# Patient Record
Sex: Male | Born: 1985 | Race: Black or African American | Hispanic: No | Smoking: Never smoker
Health system: Southern US, Community
[De-identification: ages and names within clinical notes are randomized; demographics above are authoritative.]

## PROBLEM LIST (undated history)

## (undated) DIAGNOSIS — J302 Other seasonal allergic rhinitis: Secondary | ICD-10-CM

## (undated) DIAGNOSIS — D696 Thrombocytopenia, unspecified: Secondary | ICD-10-CM

## (undated) HISTORY — DX: Thrombocytopenia, unspecified: D69.6

## (undated) HISTORY — PX: APPENDECTOMY: SHX54

## (undated) HISTORY — DX: Other seasonal allergic rhinitis: J30.2

---

## 2003-11-11 ENCOUNTER — Emergency Department (HOSPITAL_COMMUNITY): Admission: EM | Admit: 2003-11-11 | Discharge: 2003-11-11 | Payer: Self-pay | Admitting: Emergency Medicine

## 2004-06-01 ENCOUNTER — Observation Stay (HOSPITAL_COMMUNITY): Admission: EM | Admit: 2004-06-01 | Discharge: 2004-06-02 | Payer: Self-pay | Admitting: Emergency Medicine

## 2004-06-01 ENCOUNTER — Encounter (INDEPENDENT_AMBULATORY_CARE_PROVIDER_SITE_OTHER): Payer: Self-pay | Admitting: Specialist

## 2005-10-20 ENCOUNTER — Emergency Department (HOSPITAL_COMMUNITY): Admission: EM | Admit: 2005-10-20 | Discharge: 2005-10-20 | Payer: Self-pay | Admitting: Emergency Medicine

## 2006-03-07 ENCOUNTER — Emergency Department (HOSPITAL_COMMUNITY): Admission: EM | Admit: 2006-03-07 | Discharge: 2006-03-07 | Payer: Self-pay | Admitting: Emergency Medicine

## 2008-06-03 ENCOUNTER — Emergency Department (HOSPITAL_COMMUNITY): Admission: EM | Admit: 2008-06-03 | Discharge: 2008-06-03 | Payer: Self-pay | Admitting: Emergency Medicine

## 2009-03-22 ENCOUNTER — Emergency Department (HOSPITAL_COMMUNITY): Admission: EM | Admit: 2009-03-22 | Discharge: 2009-03-22 | Payer: Self-pay | Admitting: Emergency Medicine

## 2009-08-23 ENCOUNTER — Emergency Department (HOSPITAL_COMMUNITY): Admission: EM | Admit: 2009-08-23 | Discharge: 2009-08-24 | Payer: Self-pay | Admitting: Emergency Medicine

## 2010-06-10 ENCOUNTER — Emergency Department (HOSPITAL_COMMUNITY): Admission: EM | Admit: 2010-06-10 | Discharge: 2010-06-10 | Payer: Self-pay | Admitting: Emergency Medicine

## 2010-12-14 LAB — URINALYSIS, ROUTINE W REFLEX MICROSCOPIC
Bilirubin Urine: NEGATIVE
Glucose, UA: NEGATIVE mg/dL
Hgb urine dipstick: NEGATIVE
Nitrite: NEGATIVE
Specific Gravity, Urine: 1.024 (ref 1.005–1.030)
Urobilinogen, UA: 0.2 mg/dL (ref 0.0–1.0)
pH: 5.5 (ref 5.0–8.0)

## 2010-12-14 LAB — GC/CHLAMYDIA PROBE AMP, GENITAL: Chlamydia, DNA Probe: NEGATIVE

## 2011-01-03 LAB — CBC
MCHC: 33.1 g/dL (ref 30.0–36.0)
MCV: 95.6 fL (ref 78.0–100.0)
WBC: 8.6 10*3/uL (ref 4.0–10.5)

## 2011-01-03 LAB — COMPREHENSIVE METABOLIC PANEL
Albumin: 3.9 g/dL (ref 3.5–5.2)
CO2: 25 mEq/L (ref 19–32)
Creatinine, Ser: 0.89 mg/dL (ref 0.4–1.5)
GFR calc Af Amer: >60 mL/min (ref >60)
GFR calc non Af Amer: >60 mL/min (ref >60)
Potassium: 3.4 mEq/L — ABNORMAL LOW (ref 3.5–5.1)
Sodium: 137 mEq/L (ref 135–145)
Total Bilirubin: 1.1 mg/dL (ref 0.3–1.2)
Total Protein: 6.8 g/dL (ref 6.0–8.3)

## 2011-01-03 LAB — DIFFERENTIAL
Basophils Relative: 0 % (ref 0–1)
Eosinophils Relative: 0 % (ref 0–5)
Lymphs Abs: 0.9 10*3/uL (ref 0.7–4.0)
Neutrophils Relative %: 85 % — ABNORMAL HIGH (ref 43–77)

## 2011-01-08 LAB — URINALYSIS, ROUTINE W REFLEX MICROSCOPIC
Bilirubin Urine: NEGATIVE
Ketones, ur: NEGATIVE mg/dL
Nitrite: NEGATIVE
Protein, ur: NEGATIVE mg/dL
Specific Gravity, Urine: 1.025 (ref 1.005–1.030)
pH: 6 (ref 5.0–8.0)

## 2011-01-08 LAB — CBC
HCT: 43.5 % (ref 39.0–52.0)
MCHC: 33.5 g/dL (ref 30.0–36.0)
Platelets: 151 10*3/uL (ref 150–400)
RBC: 4.54 MIL/uL (ref 4.22–5.81)
RDW: 12.9 % (ref 11.5–15.5)

## 2011-01-08 LAB — COMPREHENSIVE METABOLIC PANEL
ALT: 28 U/L (ref 0–53)
AST: 25 U/L (ref 0–37)
BUN: 15 mg/dL (ref 6–23)
Calcium: 9.5 mg/dL (ref 8.4–10.5)
Creatinine, Ser: 1.01 mg/dL (ref 0.4–1.5)
GFR calc non Af Amer: >60 mL/min (ref >60)
Total Bilirubin: 0.8 mg/dL (ref 0.3–1.2)

## 2011-01-08 LAB — DIFFERENTIAL
Basophils Relative: 1 % (ref 0–1)
Eosinophils Absolute: 0.1 10*3/uL (ref 0.0–0.7)
Lymphs Abs: 1.1 10*3/uL (ref 0.7–4.0)
Monocytes Relative: 6 % (ref 3–12)
Neutrophils Relative %: 77 % (ref 43–77)

## 2011-01-08 LAB — LIPASE, BLOOD: Lipase: 28 U/L (ref 11–59)

## 2011-02-16 NOTE — Op Note (Signed)
[name], Jacob Chung                            ACCOUNT NO.:  [id number]   MEDICAL RECORD NO.:  [id number]                   PATIENT TYPE:  INP   LOCATION:  0103                                 FACILITY:  [facility]   PHYSICIAN:  Vikki Ports, M.D.         DATE OF BIRTH:  1985-12-20   DATE OF PROCEDURE:  06/01/2004  DATE OF DISCHARGE:                                 OPERATIVE REPORT   PREOPERATIVE DIAGNOSIS:  Acute appendicitis.   POSTOPERATIVE DIAGNOSIS:  Acute appendicitis.   PROCEDURE:  Laparoscopic appendectomy.   SURGEON:  Vikki Ports, M.D.   ANESTHESIA:  General.   DESCRIPTION OF PROCEDURE:  Patient was taken to the operating room and  placed in a supine position.  After adequate general anesthesia was induced  using an endotracheal tube, the abdomen was prepped and draped in a normal  sterile fashion.  Using a transverse right upper quadrant incision, the 12  mm Optiview trocar was placed in the abdomen under direct visualization.  Then 5 mm and additional 12 mm trocars were placed in the left abdomen.  A  very large, edematous appendix was identified.  No free fluid.  No evidence  of abscess.  It was quite tense and retracted anteriorly.  This easily  visualized the mesoappendix, which was taken down with a harmonic scalpel.  The base of the appendix was transected with a right-load Ethicon GIA  stapling device and placed in the EndoCatch bag.  This was removed through  the left lower quadrant port.  Fascia was closed because of the patient's  very thin habitus with a figure-of-eight 0 Vicryl suture.  Skin incisions  were closed with staples, and incisions were injected with 0.5 Marcaine.  Patient tolerated the procedure well and went to the PACU in good condition.                                               Vikki Ports, M.D.    KRH/MEDQ  D:  06/01/2004  T:  06/01/2004  Job:  [id number]

## 2011-07-04 LAB — DIFFERENTIAL
Basophils Relative: 0
Eosinophils Relative: 0
Lymphocytes Relative: 4 — ABNORMAL LOW

## 2011-07-04 LAB — CBC
Hemoglobin: 14.6
MCHC: 33.6
WBC: 10.7 — ABNORMAL HIGH

## 2016-02-28 ENCOUNTER — Encounter (HOSPITAL_COMMUNITY): Payer: Self-pay | Admitting: Emergency Medicine

## 2016-02-28 ENCOUNTER — Emergency Department (HOSPITAL_COMMUNITY)
Admission: EM | Admit: 2016-02-28 | Discharge: 2016-02-29 | Disposition: A | Discharge status: Home / Self Care | Payer: BLUE CROSS/BLUE SHIELD | Attending: Emergency Medicine | Admitting: Emergency Medicine

## 2016-02-28 DIAGNOSIS — Z79899 Other long term (current) drug therapy: Secondary | ICD-10-CM | POA: Diagnosis not present

## 2016-02-28 DIAGNOSIS — Z202 Contact with and (suspected) exposure to infections with a predominantly sexual mode of transmission: Secondary | ICD-10-CM | POA: Insufficient documentation

## 2016-02-28 NOTE — ED Notes (Addendum)
Pt states he had protected sex with an acquaintance this weekend and she called him today and told him that he needed to be tested for HIV  Pt states he wore a condom each time but still wants to be tested for STDs and HIV  Pt is very tearful in triage

## 2016-02-28 NOTE — ED Provider Notes (Signed)
CSN: [id number]     Arrival date & time 02/28/16  2054 History  By signing my name below, I, Jacob Chung, attest that this documentation has been prepared under the direction and in the presence of Jacob Ichael Pullara, PA-C Electronically Signed: Soijett Chung, ED Scribe. 02/28/2016. 11:41 PM.   Chief Complaint  Patient presents with  . Exposure to STD      The history is provided by the patient. No language interpreter was used.     Jacob Chung is a 30 y.o. male who presents to the Emergency Department complaining of exposure to STD onset 3 days ago. Pt reports that he had protected intercourse with a male acquaintance that he met this past weekend and was called and informed that she was HIV+ and undetectable. Pt denies the condoms breaking during sexual intercourse. He states that he has not tried any medications for the relief for his symptoms. He denies penile discharge, fever, abdominal pain, n/v, dysuria, and any other symptoms. He denies receptive anal intercourse.     History reviewed. No pertinent past medical history. Past Surgical History  Procedure Laterality Date  . Appendectomy     Family History  Problem Relation Age of Onset  . Hypertension Other    Social History  Substance Use Topics  . Smoking status: Never Smoker   . Smokeless tobacco: None  . Alcohol Use: Yes    Review of Systems  Constitutional: Negative for fever.  Gastrointestinal: Negative for nausea, vomiting and abdominal pain.  Genitourinary: Negative for dysuria, urgency, frequency, hematuria, flank pain, discharge, penile swelling, scrotal swelling, difficulty urinating, genital sores and penile pain.  Skin: Negative for rash.    Allergies  Review of patient's allergies indicates no known allergies.  Home Medications   Prior to Admission medications   Medication Sig Start Date End Date Taking? Authorizing Provider  dolutegravir (TIVICAY) 50 MG tablet Take 1 tablet (50 mg total) by mouth daily.  02/29/16   Waynetta Pean, PA-C  emtricitabine-tenofovir (TRUVADA) 200-300 MG tablet Take 1 tablet by mouth daily. 02/29/16   Waynetta Pean, PA-C   BP 141/99 mmHg  Pulse 72  Temp(Src) 97.8 F (36.6 C) (Oral)  Resp 12  SpO2 99% Physical Exam  Constitutional: He appears well-developed and well-nourished. No distress.  Nontoxic appearing.  HENT:  Head: Normocephalic and atraumatic.  Eyes: Right eye exhibits no discharge. Left eye exhibits no discharge.  Pulmonary/Chest: Effort normal. No respiratory distress.  Neurological: He is alert. Coordination normal.  Skin: No rash noted. He is not diaphoretic.  Psychiatric: His behavior is normal. His mood appears anxious.  Patient appears very anxious.   Nursing note and vitals reviewed.   ED Course  Procedures (including critical care time) DIAGNOSTIC STUDIES: Oxygen Saturation is 99% on RA, nl by my interpretation.    COORDINATION OF CARE: 10:38 PM Discussed treatment plan with pt at bedside which includes HIV RNA, RPR and pt agreed to plan.    Labs Review Labs Reviewed  COMPREHENSIVE METABOLIC PANEL - Abnormal; Notable for the following:    Glucose, Bld 116 (*)    All other components within normal limits  CBC WITH DIFFERENTIAL/PLATELET - Abnormal; Notable for the following:    Neutro Abs 8.0 (*)    All other components within normal limits  RPR  HIV-1 RNA, QUALITATIVE, TMA  GC/CHLAMYDIA PROBE AMP ([place]) NOT AT [facility]    Imaging Review No results found. I have personally reviewed and evaluated these lab results as part of my  medical decision-making.   EKG Interpretation None      Filed Vitals:   02/28/16 2111 02/29/16 0149  BP: 150/103 141/99  Pulse: 97 72  Temp: 98.2 F (36.8 C) 97.8 F (36.6 C)  TempSrc: Oral Oral  Resp: 12 12  SpO2: 99% 99%     MDM   Meds given in ED:  Medications - No data to display  New Prescriptions   DOLUTEGRAVIR (TIVICAY) 50 MG TABLET    Take 1 tablet (50 mg total) by  mouth daily.   EMTRICITABINE-TENOFOVIR (TRUVADA) 200-300 MG TABLET    Take 1 tablet by mouth daily.    Final diagnoses:  Exposure to STD   This is a 30 y.o. male who presents to the Emergency Department complaining of exposure to STD onset 3 days ago. Pt reports that he had protected intercourse with a male acquaintance that he met this past weekend and was called and informed that she was HIV+ and undetectable. Pt denies the condoms breaking during sexual intercourse.  On exam the patient is afebrile and non-toxic appearing. He is very anxious. I explained that we would test him for HIV, RPR, Gonorrhea and chlamydia. I advised these tests take several days to come back. He has no active symptoms of a STI currently. I advised he does not meet criteria for post exposure prophylaxis as the patient had protected intercourse. Despite this the patient had questions about PEP and requested to speak to my supervising physician. After we both had an extensive conversations with the patient he would like PEP. He also requests that the test be sped up if he paid extra. I advised this was not possible. I called main lab as well as [facility] to find out that the test results take approximately 4 days to return. I discussed the risks and side effects of PEP and the patient would still like this despite this being against guidelines. [facility] check blood work.  CBC and CMP are unremarkable. Jacob discharge with prescriptions for today and Truvada for post exposure prophylaxis. I advised needs to follow-up later this week with his primary care provider to have repeat blood work. I advised that his STD testing is pending. I encouraged him to follow-up in several days for test results. I advised to abstain from sexual intercourse until his test results return. I discussed her concerns specific return precautions. I advised the patient to follow-up with their primary care provider this week. I advised the patient to return to the  emergency department with new or worsening symptoms or new concerns. The patient verbalized understanding and agreement with plan.   This patient was discussed with and evaluated by Dr. Alfonse Spruce who agrees with assessment and plan.   I personally performed the services described in this documentation, which was scribed in my presence. The recorded information has been reviewed and is accurate.      Waynetta Pean, PA-C 02/29/16 0216  Harvel Quale, MD 03/03/16 [phone]

## 2016-02-29 LAB — CBC WITH DIFFERENTIAL/PLATELET
Basophils Absolute: 0 10*3/uL (ref 0.0–0.1)
Basophils Relative: 0 %
EOS PCT: 0 %
Eosinophils Absolute: 0 10*3/uL (ref 0.0–0.7)
HCT: 44.2 % (ref 39.0–52.0)
Hemoglobin: 15.1 g/dL (ref 13.0–17.0)
LYMPHS ABS: 1.2 10*3/uL (ref 0.7–4.0)
LYMPHS PCT: 13 %
MCH: 32.1 pg (ref 26.0–34.0)
MCHC: 34.2 g/dL (ref 30.0–36.0)
MCV: 94 fL (ref 78.0–100.0)
MONO ABS: 0.5 10*3/uL (ref 0.1–1.0)
MONOS PCT: 5 %
Neutro Abs: 8 10*3/uL — ABNORMAL HIGH (ref 1.7–7.7)
Neutrophils Relative %: 82 %
Platelets: 157 10*3/uL (ref 150–400)
RBC: 4.7 MIL/uL (ref 4.22–5.81)
RDW: 12.6 % (ref 11.5–15.5)
WBC: 9.8 10*3/uL (ref 4.0–10.5)

## 2016-02-29 LAB — COMPREHENSIVE METABOLIC PANEL
ALBUMIN: 5 g/dL (ref 3.5–5.0)
ALT: 45 U/L (ref 17–63)
AST: 32 U/L (ref 15–41)
Alkaline Phosphatase: 80 U/L (ref 38–126)
Anion gap: 8 (ref 5–15)
BUN: 10 mg/dL (ref 6–20)
CHLORIDE: 106 mmol/L (ref 101–111)
CO2: 26 mmol/L (ref 22–32)
Calcium: 9.5 mg/dL (ref 8.9–10.3)
Creatinine, Ser: 1.02 mg/dL (ref 0.61–1.24)
GFR calc Af Amer: >60 mL/min (ref >60)
GFR calc non Af Amer: >60 mL/min (ref >60)
GLUCOSE: 116 mg/dL — AB (ref 65–99)
POTASSIUM: 4.5 mmol/L (ref 3.5–5.1)
Sodium: 140 mmol/L (ref 135–145)
Total Bilirubin: 1 mg/dL (ref 0.3–1.2)
Total Protein: 8.1 g/dL (ref 6.5–8.1)

## 2016-02-29 LAB — RPR: RPR: NONREACTIVE

## 2016-02-29 LAB — GC/CHLAMYDIA PROBE AMP (~~LOC~~) NOT AT ARMC
Chlamydia: NEGATIVE
NEISSERIA GONORRHEA: NEGATIVE

## 2016-02-29 MED ORDER — DOLUTEGRAVIR SODIUM 50 MG PO TABS: 50.0000 mg | ORAL_TABLET | Freq: Every day | ORAL | Status: DC

## 2016-02-29 MED ORDER — EMTRICITABINE-TENOFOVIR DF 200-300 MG PO TABS: 1.0000 | ORAL_TABLET | Freq: Every day | ORAL | Status: DC

## 2016-02-29 NOTE — Discharge Instructions (Signed)
Please follow up with your primary care provider this week for follow up. STD testing is pending.  Sexually Transmitted Disease A sexually transmitted disease (STD) is a disease or infection that may be passed (transmitted) from person to person, usually during sexual activity. This may happen by way of saliva, semen, blood, vaginal mucus, or urine. Common STDs include:  Gonorrhea.  Chlamydia.  Syphilis.  HIV and AIDS.  Genital herpes.  Hepatitis B and C.  Trichomonas.  Human papillomavirus (HPV).  Pubic lice.  Scabies.  Mites.  Bacterial vaginosis. WHAT ARE CAUSES OF STDs? An STD may be caused by bacteria, a virus, or parasites. STDs are often transmitted during sexual activity if one person is infected. However, they may also be transmitted through nonsexual means. STDs may be transmitted after:   Sexual intercourse with an infected person.  Sharing sex toys with an infected person.  Sharing needles with an infected person or using unclean piercing or tattoo needles.  Having intimate contact with the genitals, mouth, or rectal areas of an infected person.  Exposure to infected fluids during birth. WHAT ARE THE SIGNS AND SYMPTOMS OF STDs? Different STDs have different symptoms. Some people may not have any symptoms. If symptoms are present, they may include:  Painful or bloody urination.  Pain in the pelvis, abdomen, vagina, anus, throat, or eyes.  A skin rash, itching, or irritation.  Growths, ulcerations, blisters, or sores in the genital and anal areas.  Abnormal vaginal discharge with or without bad odor.  Penile discharge in men.  Fever.  Pain or bleeding during sexual intercourse.  Swollen glands in the groin area.  Yellow skin and eyes (jaundice). This is seen with hepatitis.  Swollen testicles.  Infertility.  Sores and blisters in the mouth. HOW ARE STDs DIAGNOSED? To make a diagnosis, your health care provider may:  Take a medical  history.  Perform a physical exam.  Take a sample of any discharge to examine.  Swab the throat, cervix, opening to the penis, rectum, or vagina for testing.  Test a sample of your first morning urine.  Perform blood tests.  Perform a Pap test, if this applies.  Perform a colposcopy.  Perform a laparoscopy. HOW ARE STDs TREATED? Treatment depends on the STD. Some STDs may be treated but not cured.  Chlamydia, gonorrhea, trichomonas, and syphilis can be cured with antibiotic medicine.  Genital herpes, hepatitis, and HIV can be treated, but not cured, with prescribed medicines. The medicines lessen symptoms.  Genital warts from HPV can be treated with medicine or by freezing, burning (electrocautery), or surgery. Warts may come back.  HPV cannot be cured with medicine or surgery. However, abnormal areas may be removed from the cervix, vagina, or vulva.  If your diagnosis is confirmed, your recent sexual partners need treatment. This is true even if they are symptom-free or have a negative culture or evaluation. They should not have sex until their health care providers say it is okay.  Your health care provider may test you for infection again 3 months after treatment. HOW CAN I REDUCE MY RISK OF GETTING AN STD? Take these steps to reduce your risk of getting an STD:  Use latex condoms, dental dams, and water-soluble lubricants during sexual activity. Do not use petroleum jelly or oils.  Avoid having multiple sex partners.  Do not have sex with someone who has other sex partners  Do not have sex with anyone you do not know or who is at high risk  for an STD.  Avoid risky sex practices that can break your skin.  Do not have sex if you have open sores on your mouth or skin.  Avoid drinking too much alcohol or taking illegal drugs. Alcohol and drugs can affect your judgment and put you in a vulnerable position.  Avoid engaging in oral and anal sex acts.  Get vaccinated for  HPV and hepatitis. If you have not received these vaccines in the past, talk to your health care provider about whether one or both might be right for you.  If you are at risk of being infected with HIV, it is recommended that you take a prescription medicine daily to prevent HIV infection. This is called pre-exposure prophylaxis (PrEP). You are considered at risk if:  You are a man who has sex with other men (MSM).  You are a heterosexual man or woman and are sexually active with more than one partner.  You take drugs by injection.  You are sexually active with a partner who has HIV.  Talk with your health care provider about whether you are at high risk of being infected with HIV. If you choose to begin PrEP, you should first be tested for HIV. You should then be tested every 3 months for as long as you are taking PrEP. WHAT SHOULD I DO IF I THINK I HAVE AN STD?  See your health care provider.  Tell your sexual partner(s). They should be tested and treated for any STDs.  Do not have sex until your health care provider says it is okay. WHEN SHOULD I GET IMMEDIATE MEDICAL CARE? Contact your health care provider right away if:   You have severe abdominal pain.  You are a man and notice swelling or pain in your testicles.  You are a woman and notice swelling or pain in your vagina.   This information is not intended to replace advice given to you by your health care provider. Make sure you discuss any questions you have with your health care provider.   Document Released: 12/08/2002 Document Revised: 10/08/2014 Document Reviewed: 04/07/2013 Elsevier Interactive Patient Education [employer]2016 Elsevier Inc.

## 2016-03-02 LAB — HIV-1 RNA, QUALITATIVE, TMA: HIV-1 RNA, QUAL: NEGATIVE

## 2016-03-03 ENCOUNTER — Telehealth (HOSPITAL_BASED_OUTPATIENT_CLINIC_OR_DEPARTMENT_OTHER): Payer: Self-pay

## 2017-02-10 ENCOUNTER — Encounter (HOSPITAL_COMMUNITY): Payer: Self-pay

## 2017-02-10 ENCOUNTER — Emergency Department (HOSPITAL_COMMUNITY)
Admission: EM | Admit: 2017-02-10 | Discharge: 2017-02-10 | Disposition: A | Discharge status: Home / Self Care | Payer: BLUE CROSS/BLUE SHIELD | Attending: Emergency Medicine | Admitting: Emergency Medicine

## 2017-02-10 DIAGNOSIS — R030 Elevated blood-pressure reading, without diagnosis of hypertension: Secondary | ICD-10-CM | POA: Diagnosis not present

## 2017-02-10 DIAGNOSIS — Z113 Encounter for screening for infections with a predominantly sexual mode of transmission: Secondary | ICD-10-CM

## 2017-02-10 DIAGNOSIS — Z8619 Personal history of other infectious and parasitic diseases: Secondary | ICD-10-CM | POA: Diagnosis not present

## 2017-02-10 DIAGNOSIS — Z202 Contact with and (suspected) exposure to infections with a predominantly sexual mode of transmission: Secondary | ICD-10-CM | POA: Diagnosis present

## 2017-02-10 NOTE — ED Provider Notes (Signed)
[facility]-EMERGENCY DEPT Provider Note   CSN: [id number] Arrival date & time: 02/10/17  [id number]     History   Chief Complaint Chief Complaint  Patient presents with  . STD check    HPI Jacob PontiffSteven Chung is a 31 y.o. male.  Jacob SpareSteven Chung is a 31 y.o. Male who presents to [facility] emergency department requesting to be checked for STDs. Patient reports he had unprotected oral intercourse and was [facility] and tested positive for oral chlamydia. He was treated with Rocephin and azithromycin by [facility]. He is here today for "a second opinion." He tells me he would like to be rechecked as follow-up to being treated 3 days ago for oral gonorrhea and chlamydia. He tells me his other study checks at [facility] returned negative including his urine gonorrhea and chlamydia. He denies any symptoms of an STD. He denies fevers, mouth sores, sore throat, penile discharge, pain while urinating, or rashes.   [facility] history is provided by [facility] patient and medical records. No language interpreter was used.    History reviewed. No pertinent past medical history.  There are no active problems to display for this patient.   Past Surgical History:  Procedure Laterality Date  . APPENDECTOMY         Home Medications    Prior to Admission medications   Medication Sig Start Date End Date Taking? Authorizing Provider  dolutegravir (TIVICAY) 50 MG tablet Take 1 tablet (50 mg total) by mouth daily. 02/29/16   Everlene Farrieransie, Styles Fambro, PA-C  emtricitabine-tenofovir (TRUVADA) 200-300 MG tablet Take 1 tablet by mouth daily. 02/29/16   Everlene Farrieransie, Duran Ohern, PA-C    Family History Family History  Problem Relation Age of Onset  . Hypertension Other     Social History Social History  Substance Use Topics  . Smoking status: Never Smoker  . Smokeless tobacco: Never Used  . Alcohol use Yes     Allergies   Patient has no known allergies.   Review of Systems Review of Systems    Constitutional: Negative for fever.  HENT: Negative for mouth sores, sore throat and trouble swallowing.   Gastrointestinal: Negative for abdominal pain and vomiting.  Genitourinary: Negative for discharge, dysuria, frequency, penile pain and testicular pain.  Skin: Negative for rash and wound.     Physical Exam Updated Vital Signs BP (!) 139/100 (BP Location: Left Arm)   Pulse 81   Temp 97.6 F (36.4 C) (Oral)   Resp 16   SpO2 98%   Physical Exam  Constitutional: He appears well-developed and well-nourished. No distress.  HENT:  Head: Normocephalic and atraumatic.  Right Ear: External ear normal.  Left Ear: External ear normal.  Mouth/Throat: Oropharynx is clear and moist. No oropharyngeal exudate.  Throat is clear. No oral lesions. No exudates.   Eyes: Conjunctivae are normal. Pupils are equal, round, and reactive to light. Right eye exhibits no discharge. Left eye exhibits no discharge.  Neck: Neck supple.  Cardiovascular: Normal rate, regular rhythm, normal heart sounds and intact distal pulses.   Pulmonary/Chest: Effort normal and breath sounds normal. No respiratory distress.  Abdominal: Soft. There is no tenderness. There is no guarding.  Genitourinary:  Genitourinary Comments: Patient declines GU exam.   Lymphadenopathy:    He has no cervical adenopathy.  Neurological: He is alert. Coordination normal.  Skin: Skin is warm and dry. Capillary refill takes less than 2 seconds. No rash noted. He is not diaphoretic. No erythema. No pallor.  Psychiatric:  He has a normal mood and affect. His behavior is normal.  Nursing note and vitals reviewed.    ED Treatments / Results  Labs (all labs ordered are listed, but only abnormal results are displayed) Labs Reviewed  RPR  HIV ANTIBODY (ROUTINE TESTING)  GC/CHLAMYDIA PROBE AMP ([place]) NOT AT [facility]  GC/CHLAMYDIA PROBE AMP ([place]) NOT AT [facility]    EKG  EKG Interpretation None       Radiology No results  found.  Procedures Procedures (including critical care time)  Medications Ordered in ED Medications - No data to display   Initial Impression / Assessment and Plan / ED Course  I have reviewed Jacob triage vital signs and [facility] nursing notes.  Pertinent labs & imaging results that were available during my care of [facility] patient were reviewed by me and considered in my medical decision making (see chart for details).    This is a 31 y.o. Male who presents to [facility] requesting to be checked for STDs. Patient reports he had unprotected oral intercourse and was [facility] and tested positive for oral chlamydia. He was treated with Rocephin and azithromycin by [facility]. He is here today for "a second opinion." He tells me he would like to be rechecked as follow-up to being treated 3 days ago for oral gonorrhea and chlamydia. He tells me his other study checks at [facility] returned negative including his urine gonorrhea and chlamydia. He denies any symptoms of an STD.  On exam [facility] patient is afebrile nontoxic appearing. Abdomen is soft and nontender to palpation. Throat is clear. No exudates. Patient declines GU exam.  I discussed with [facility] patient that I be happy to recheck him for STDs today. Will also obtain an oral gonorrhea and chlamydia swab today. I did discuss that he was very recently treated and this may still returned positive as he was just treated about 3 days ago. I also had a discussion with [facility] patient about other options for him for follow-up on STDs. Did discuss [facility] of [facility] and his primary care provider. I discussed with my inability to follow-up with him in person with his test results and encouraged close follow-up by his primary care provider. Patient tells me he is upset that I discussed other options for follow up for STDs and that he had no other options today for his follow up because his primary care office  is closed. I apologized and again explained that I would be testing him today, but wanted to also make him aware that other options often provide better follow up for STDs than [facility]. I encouraged safe sex practices and will discharge after completion of his lab draw. STD testing pending. Patient already treated for gonorrhea and chlamydia at an outside facility. I advised [facility] patient to follow-up with their primary care provider this week. I advised [facility] patient to return to [facility] with new or worsening symptoms or new concerns. [facility] patient verbalized understanding and agreement with plan.    Final Clinical Impressions(s) / ED Diagnoses   Final diagnoses:  Screening examination for STD (sexually transmitted disease)  History of chlamydia infection  Elevated blood pressure reading    New Prescriptions New Prescriptions   No medications on file     Everlene Farrier, Cordelia Poche 02/10/17 1610    Jerelyn Scott, MD 02/10/17 [phone]

## 2017-02-10 NOTE — Discharge Instructions (Signed)
Please follow up in about 3 days for the results of your STD testing. Please follow up with primary care to have your blood pressure rechecked.

## 2017-02-10 NOTE — ED Triage Notes (Addendum)
Pt presents asking for a STD check.  Denies symptoms.  Sts "I just want to be double checked.  [facility] said I tested negative for gonorrhea in my urine, but tested positive orally."  Sts only one sexual partner.  Pt was treated w/ "a shot and 2 pills."

## 2017-02-11 LAB — GC/CHLAMYDIA PROBE AMP (~~LOC~~) NOT AT ARMC
CHLAMYDIA, DNA PROBE: NEGATIVE
Chlamydia: NEGATIVE
Neisseria Gonorrhea: NEGATIVE
Neisseria Gonorrhea: NEGATIVE

## 2017-02-11 LAB — RPR: RPR: NONREACTIVE

## 2017-02-11 LAB — HIV ANTIBODY (ROUTINE TESTING W REFLEX): HIV Screen 4th Generation wRfx: NONREACTIVE

## 2017-12-20 ENCOUNTER — Ambulatory Visit (INDEPENDENT_AMBULATORY_CARE_PROVIDER_SITE_OTHER): Payer: BLUE CROSS/BLUE SHIELD | Admitting: Pharmacist Clinician (PhC)/ Clinical Pharmacy Specialist

## 2017-12-20 DIAGNOSIS — Z206 Contact with and (suspected) exposure to human immunodeficiency virus [HIV]: Secondary | ICD-10-CM

## 2017-12-20 MED ORDER — DARUN-COBIC-EMTRICIT-TENOFAF 800-150-200-10 MG PO TABS: 1.0000 | ORAL_TABLET | Freq: Every day | ORAL | 0 refills | Status: DC

## 2017-12-20 MED FILL — SYMTUZA 800-150-200-10 MG T: 800-150-200 | 30 days supply | Qty: 30 | Fill #0
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
## 2017-12-23 ENCOUNTER — Telehealth: Payer: Self-pay | Admitting: Pharmacist Clinician (PhC)/ Clinical Pharmacy Specialist

## 2017-12-23 NOTE — Telephone Encounter (Signed)
Not sure why he called but left a VM to call back.

## 2017-12-23 NOTE — Telephone Encounter (Signed)
Jacob Chung started on Symtuza for his PEP on Friday. He said that he had issue with nausea and vomiting with it. Offered to give him some zofran but he said it was ok. He said that the woman that he slept with told him that she was suppressed on therapy.

## 2021-01-29 DIAGNOSIS — Z20822 Contact with and (suspected) exposure to covid-19: Secondary | ICD-10-CM | POA: Diagnosis not present

## 2021-03-09 DIAGNOSIS — J069 Acute upper respiratory infection, unspecified: Secondary | ICD-10-CM | POA: Diagnosis not present

## 2021-05-04 DIAGNOSIS — Z20822 Contact with and (suspected) exposure to covid-19: Secondary | ICD-10-CM | POA: Diagnosis not present

## 2021-06-08 DIAGNOSIS — N503 Cyst of epididymis: Secondary | ICD-10-CM | POA: Diagnosis not present

## 2021-06-08 DIAGNOSIS — D290 Benign neoplasm of penis: Secondary | ICD-10-CM | POA: Diagnosis not present

## 2021-06-09 ENCOUNTER — Other Ambulatory Visit: Payer: Self-pay | Admitting: Urology

## 2021-06-09 DIAGNOSIS — N503 Cyst of epididymis: Secondary | ICD-10-CM

## 2021-06-13 ENCOUNTER — Ambulatory Visit
Admission: RE | Admit: 2021-06-13 | Discharge: 2021-06-13 | Disposition: A | Discharge status: Home / Self Care | Payer: BC Managed Care – PPO | Source: Ambulatory Visit | Attending: Urology | Admitting: Urology

## 2021-06-13 DIAGNOSIS — N503 Cyst of epididymis: Secondary | ICD-10-CM

## 2021-06-20 ENCOUNTER — Other Ambulatory Visit: Payer: BLUE CROSS/BLUE SHIELD

## 2021-06-22 DIAGNOSIS — N50811 Right testicular pain: Secondary | ICD-10-CM | POA: Diagnosis not present

## 2021-06-22 DIAGNOSIS — N503 Cyst of epididymis: Secondary | ICD-10-CM | POA: Diagnosis not present

## 2021-08-16 DIAGNOSIS — R112 Nausea with vomiting, unspecified: Secondary | ICD-10-CM | POA: Diagnosis not present

## 2021-09-12 DIAGNOSIS — Z1159 Encounter for screening for other viral diseases: Secondary | ICD-10-CM | POA: Diagnosis not present

## 2021-09-12 DIAGNOSIS — R0789 Other chest pain: Secondary | ICD-10-CM | POA: Diagnosis not present

## 2021-11-19 DIAGNOSIS — Z20822 Contact with and (suspected) exposure to covid-19: Secondary | ICD-10-CM | POA: Diagnosis not present

## 2021-11-21 DIAGNOSIS — Z202 Contact with and (suspected) exposure to infections with a predominantly sexual mode of transmission: Secondary | ICD-10-CM | POA: Diagnosis not present

## 2021-11-21 DIAGNOSIS — B349 Viral infection, unspecified: Secondary | ICD-10-CM | POA: Diagnosis not present

## 2021-11-21 DIAGNOSIS — R110 Nausea: Secondary | ICD-10-CM | POA: Diagnosis not present

## 2022-02-11 ENCOUNTER — Ambulatory Visit
Admission: EM | Admit: 2022-02-11 | Discharge: 2022-02-11 | Disposition: A | Discharge status: Home / Self Care | Payer: BC Managed Care – PPO

## 2022-02-11 DIAGNOSIS — M545 Low back pain, unspecified: Secondary | ICD-10-CM

## 2022-02-11 DIAGNOSIS — R519 Headache, unspecified: Secondary | ICD-10-CM | POA: Diagnosis not present

## 2022-02-11 DIAGNOSIS — S39012A Strain of muscle, fascia and tendon of lower back, initial encounter: Secondary | ICD-10-CM

## 2022-02-11 MED ORDER — TIZANIDINE HCL 4 MG PO TABS: 4.0000 mg | ORAL_TABLET | Freq: Every day | ORAL | 0 refills | Status: DC

## 2022-02-11 MED ORDER — NAPROXEN 500 MG PO TABS: 500.0000 mg | ORAL_TABLET | Freq: Two times a day (BID) | ORAL | 0 refills | Status: DC
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
## 2022-02-14 ENCOUNTER — Ambulatory Visit
Admission: EM | Admit: 2022-02-14 | Discharge: 2022-02-14 | Disposition: A | Discharge status: Home / Self Care | Payer: BC Managed Care – PPO | Attending: Urgent Care | Admitting: Urgent Care

## 2022-02-14 ENCOUNTER — Ambulatory Visit (HOSPITAL_BASED_OUTPATIENT_CLINIC_OR_DEPARTMENT_OTHER)
Admission: RE | Admit: 2022-02-14 | Discharge: 2022-02-14 | Disposition: A | Discharge status: Home / Self Care | Payer: BLUE CROSS/BLUE SHIELD | Source: Ambulatory Visit | Attending: Urgent Care | Admitting: Urgent Care

## 2022-02-14 DIAGNOSIS — M47816 Spondylosis without myelopathy or radiculopathy, lumbar region: Secondary | ICD-10-CM

## 2022-02-14 DIAGNOSIS — M545 Low back pain, unspecified: Secondary | ICD-10-CM | POA: Insufficient documentation

## 2022-02-14 DIAGNOSIS — M79601 Pain in right arm: Secondary | ICD-10-CM

## 2022-02-14 DIAGNOSIS — M79621 Pain in right upper arm: Secondary | ICD-10-CM | POA: Diagnosis not present

## 2022-02-14 MED ORDER — PREDNISONE 20 MG PO TABS
ORAL_TABLET | ORAL | 0 refills | Status: DC
  Filled 2022-02-14: qty 10, 5d supply, fill #0
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
## 2022-02-14 NOTE — Discharge Instructions (Addendum)
I have placed orders to have an x-ray done at the med center in [facility].  Please had there now.  I will call you with your results and update our treatment plan if necessary after I get the report.  If the patient access staff at the main hospital is gone for the day then just check in through the emergency room. Do not register as a patient to be seen there; you are just there for an outpatient x-ray.  ?
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
## 2022-02-15 ENCOUNTER — Other Ambulatory Visit (HOSPITAL_COMMUNITY): Payer: Self-pay

## 2022-02-22 DIAGNOSIS — M25511 Pain in right shoulder: Secondary | ICD-10-CM | POA: Diagnosis not present

## 2022-02-22 DIAGNOSIS — M5451 Vertebrogenic low back pain: Secondary | ICD-10-CM | POA: Diagnosis not present

## 2022-02-23 DIAGNOSIS — Z0000 Encounter for general adult medical examination without abnormal findings: Secondary | ICD-10-CM | POA: Diagnosis not present

## 2022-02-23 DIAGNOSIS — Z113 Encounter for screening for infections with a predominantly sexual mode of transmission: Secondary | ICD-10-CM | POA: Diagnosis not present

## 2022-02-23 DIAGNOSIS — R0789 Other chest pain: Secondary | ICD-10-CM | POA: Diagnosis not present

## 2022-02-24 ENCOUNTER — Other Ambulatory Visit (HOSPITAL_COMMUNITY): Payer: Self-pay

## 2022-02-27 ENCOUNTER — Telehealth: Payer: Self-pay

## 2022-02-27 NOTE — Telephone Encounter (Signed)
NOTES SCANNED TO REFERRAL 

## 2022-03-09 DIAGNOSIS — M5451 Vertebrogenic low back pain: Secondary | ICD-10-CM | POA: Diagnosis not present

## 2022-03-15 ENCOUNTER — Ambulatory Visit: Payer: BLUE CROSS/BLUE SHIELD | Admitting: Cardiology

## 2022-03-15 DIAGNOSIS — M5451 Vertebrogenic low back pain: Secondary | ICD-10-CM | POA: Diagnosis not present

## 2022-03-19 ENCOUNTER — Encounter: Payer: Self-pay | Admitting: Cardiology

## 2022-03-19 ENCOUNTER — Ambulatory Visit (INDEPENDENT_AMBULATORY_CARE_PROVIDER_SITE_OTHER): Payer: BC Managed Care – PPO | Admitting: Cardiology

## 2022-03-19 ENCOUNTER — Ambulatory Visit: Payer: BLUE CROSS/BLUE SHIELD | Admitting: Cardiology

## 2022-03-19 VITALS — BP 126/70 | HR 65 | Ht 69.0 in | Wt 169.0 lb

## 2022-03-19 DIAGNOSIS — R079 Chest pain, unspecified: Secondary | ICD-10-CM

## 2022-03-19 NOTE — Patient Instructions (Addendum)
Medication Instructions:  No changes   *If you need a refill on your cardiac medications before your next appointment, please call your pharmacy*   Lab Work:  Not needed   Testing/Procedures: Will be schedule at [address] Your physician has requested that you have an exercise tolerance test. For further information please visit [email or web address] Please also follow instruction sheet, as given.   Follow-Up: At [facility], you and your health needs are our priority.  As part of our continuing mission to provide you with exceptional heart care, we have created designated Provider Care Teams.  These Care Teams include your primary Cardiologist (physician) and Advanced Practice Providers (APPs -  Physician Assistants and Nurse Practitioners) who all work together to provide you with the care you need, when you need it.     Your next appointment:    1 to 3 month(s)  The format for your next appointment:   In Person  Provider:   Bryan Lemma, MD    Other Instructions    The stress test  involve walking on a treadmill.  The test will take approximately 1 to 2 hours to complete.  How to prepare for your test: Do not eat or drink 2 hours prior to your test Do not consume products containing caffeine 12 hours prior to your test (examples: coffee (regular OR decaf), chocolate, sodas, tea) Your doctor may need you to hold certain medications prior to the test.  If so, these are listed below and should not be taken for 24 hours prior to the test.  If not listed below, you may take your medications as normal.  You may resume taking held medications on your normal schedule once the test is complete.   Meds to hold: none Do bring a list of your current medications with you.  If you have held any meds in preparation for the test, please bring them, as you may be required to take them once the test is completed. Do wear comfortable clothes and walking shoes.  Do not wear  dresses or overalls. Do NOT wear cologne, perfume, aftershave, or fragranced lotions the day of your test (deodorants okay). If these instructions are not followed your test will have to be rescheduled.   A nuclear cardiologist will review your test, prepare a report and send it to your physician.   If you have questions or concerns about your appointment, you can call the Nuclear Cardiology department at [phone]. If you cannot keep your appointment, please provide 48 hours notification to avoid a possible $50.00 charge to your account.   Please arrive 15 minutes prior to your appointment time for registration and insurance purposes

## 2022-03-19 NOTE — Progress Notes (Unsigned)
Primary Care Provider: Juluis Rainier, MD (Inactive) [facility] Cardiologist: None Electrophysiologist: None  *** SIGNED  Clinic Note: No chief complaint on file.  Chest pain, unspecified type x1 year intermittent at rest and with exertion. Unremarkable social hx. Fam Hx of HTN. BP appropriate today. LDL elevated. Will start with stress test. Discussed possible calcium score and/or CT angiogram depending on results. Plan for follow up pending initial work up.  - EKG 12-Lead - EXERCISE TOLERANCE TEST (ETT)   ===================================  ASSESSMENT/PLAN   Problem List Items Addressed This Visit   None   ===================================  HPI:    @HCCON1 @  Jacob Chung was last seen on 02/23/2022 for wellness exam and expressing symptoms of intermittent exertional chest pain.  Today he expresses chest pain both at rest and with exercises intermittently over 1 year. His only other associated symptom is occasional tingling in the left arm. He denies vision changes, headache, chest pain, shortness of breath. He also denies palpitations.   Non-smoker. Occasional alcohol use. Caffeine intake via soda and tea. Family hx remarkable for HTN.   Recent Hospitalizations: n/a  Reviewed  CV studies:    The following studies were reviewed today: (if available, images/films reviewed: From Epic Chart or Care Everywhere) EKG 09/12/21: NSR   Interval History:   Jacob Chung   CV Review of Symptoms (Summary) Cardiovascular ROS: positive for - chest pain negative for - dyspnea on exertion, irregular heartbeat, palpitations, or shortness of breath  REVIEWED OF SYSTEMS   Review of Systems  All other systems reviewed and are negative.  I have reviewed and (if needed) personally updated the patient's problem list, medications, allergies, past medical and surgical history, social and family history.   PAST MEDICAL HISTORY   No past medical history on file.  PAST  SURGICAL HISTORY   Past Surgical History:  Procedure Laterality Date   APPENDECTOMY       There is no immunization history on file for this patient.  MEDICATIONS/ALLERGIES   Current Meds  Medication Sig   fluticasone (FLONASE ALLERGY RELIEF) 50 MCG/ACT nasal spray 1 spray in each nostril   loratadine (CLARITIN) 10 MG tablet 1 tablet    No Known Allergies  SOCIAL HISTORY/FAMILY HISTORY   Reviewed in Epic:   Social History   Tobacco Use   Smoking status: Never   Smokeless tobacco: Never  Vaping Use   Vaping Use: Never used  Substance Use Topics   Alcohol use: Yes   Drug use: No   Social History   Social History Narrative   Not on file   Family History  Problem Relation Age of Onset   Healthy Mother    Healthy Father    Hypertension Other     OBJCTIVE -PE, EKG, labs   Wt Readings from Last 3 Encounters:  03/19/22 169 lb (76.7 kg)    Physical Exam: Ht 5\' 9"  (1.753 m)   Wt 169 lb (76.7 kg)   BMI 24.96 kg/m  Physical Exam Vitals reviewed.  Constitutional:      General: He is not in acute distress.    Appearance: He is not ill-appearing, toxic-appearing or diaphoretic.  Cardiovascular:     Rate and Rhythm: Normal rate and regular rhythm.     Heart sounds: Normal heart sounds.  Pulmonary:     Effort: Pulmonary effort is normal.     Breath sounds: Normal breath sounds.  Musculoskeletal:     Right lower leg: No edema.     Left lower  leg: No edema.  Neurological:     Mental Status: He is alert.  Psychiatric:        Mood and Affect: Mood normal.        Behavior: Behavior normal.      Adult ECG Report  Rate: *** ;  Rhythm: {rhythm:17366};   Narrative Interpretation: ***  Recent Labs:  ***  No results found for: "CHOL", "HDL", "LDLCALC", "LDLDIRECT", "TRIG", "CHOLHDL" Lab Results  Component Value Date   CREATININE 1.02 02/29/2016   BUN 10 02/29/2016   NA 140 02/29/2016   K 4.5 02/29/2016   CL 106 02/29/2016   CO2 26 02/29/2016       Latest Ref Rng & Units 02/29/2016    1:15 AM 08/24/2009   12:15 AM 03/22/2009   11:08 AM  CBC  WBC 4.0 - 10.5 K/uL 9.8  8.6  6.9   Hemoglobin 13.0 - 17.0 g/dL 82.9  93.7  16.9   Hematocrit 39.0 - 52.0 % 44.2  39.5  43.5   Platelets 150 - 400 K/uL 157  157  151     No results found for: "HGBA1C" No results found for: "TSH"  ==================================================  COVID-19 Education: The signs and symptoms of COVID-19 were discussed with the patient and how to seek care for testing (follow up with PCP or arrange E-visit).    I spent a total of *** minutes with the patient spent in direct patient consultation.  Additional time spent with chart review  / charting (studies, outside notes, etc): *** min Total Time: *** min  Current medicines are reviewed at length with the patient today.  (+/- concerns) ***  This visit occurred during the SARS-CoV-2 public health emergency.  Safety protocols were in place, including screening questions prior to the visit, additional usage of staff PPE, and extensive cleaning of exam room while observing appropriate contact time as indicated for disinfecting solutions.  Notice: This dictation was prepared with Dragon dictation along with smart phrase technology. Any transcriptional errors that result from this process are unintentional and may not be corrected upon review.   Studies Ordered:  Orders Placed This Encounter  Procedures   EXERCISE TOLERANCE TEST (ETT)   EKG 12-Lead   No orders of the defined types were placed in this encounter.   Patient Instructions / Medication Changes & Studies & Tests Ordered   Patient Instructions  Medication Instructions:  No changes   *If you need a refill on your cardiac medications before your next appointment, please call your pharmacy*   Lab Work:  Not needed   Testing/Procedures: Will be schedule at [address] Your physician has requested that you have an exercise  tolerance test. For further information please visit [email or web address] Please also follow instruction sheet, as given.   Follow-Up: At [facility], you and your health needs are our priority.  As part of our continuing mission to provide you with exceptional heart care, we have created designated Provider Care Teams.  These Care Teams include your primary Cardiologist (physician) and Advanced Practice Providers (APPs -  Physician Assistants and Nurse Practitioners) who all work together to provide you with the care you need, when you need it.     Your next appointment:    1 to 3 month(s)  The format for your next appointment:   In Person  Provider:   Bryan Lemma, MD    Other Instructions    The stress test  involve walking on a treadmill.  The  test will take approximately 1 to 2 hours to complete.  How to prepare for your test: Do not eat or drink 2 hours prior to your test Do not consume products containing caffeine 12 hours prior to your test (examples: coffee (regular OR decaf), chocolate, sodas, tea) Your doctor may need you to hold certain medications prior to the test.  If so, these are listed below and should not be taken for 24 hours prior to the test.  If not listed below, you may take your medications as normal.  You may resume taking held medications on your normal schedule once the test is complete.   Meds to hold: none Do bring a list of your current medications with you.  If you have held any meds in preparation for the test, please bring them, as you may be required to take them once the test is completed. Do wear comfortable clothes and walking shoes.  Do not wear dresses or overalls. Do NOT wear cologne, perfume, aftershave, or fragranced lotions the day of your test (deodorants okay). If these instructions are not followed your test will have to be rescheduled.   A nuclear cardiologist will review your test, prepare a report and send it to your physician.   If  you have questions or concerns about your appointment, you can call the Nuclear Cardiology department at [phone] x [phone]. If you cannot keep your appointment, please provide 48 hours notification to avoid a possible $50.00 charge to your account.   Please arrive 15 minutes prior to your appointment time for registration and insurance purposes    Lavonda Jumbo, DO 03/19/2022, 1:28 PM PGY-3, [facility]    Thank you for choosing Heartcare at [facility]!!

## 2022-03-21 ENCOUNTER — Encounter: Payer: Self-pay | Admitting: Cardiology

## 2022-03-21 DIAGNOSIS — R079 Chest pain, unspecified: Secondary | ICD-10-CM | POA: Insufficient documentation

## 2022-03-21 NOTE — Assessment & Plan Note (Signed)
Chest pain, unspecified type x1 year intermittent at rest and with exertion. Unremarkable social hx. Fam Hx of HTN. BP appropriate today. LDL elevated. Will start with stress test. Discussed possible calcium score and/or CT angiogram depending on results. Plan for follow up pending initial work up.  - EKG 12-Lead - EXERCISE TOLERANCE TEST (ETT)  Otherwise healthy young man with no real cardiac risk factors.  He does have mildly elevated LDL which is a indeterminate risk at this point. Chest pain sounds relatively atypical.  Happens both at rest and with exertion is sharp only lasting couple minutes.  Not consistent.  No other symptoms and no family history. Low risk chest pain-agree with GXT/ETT.  Shared Decision Making/Informed Consent The risks [chest pain, shortness of breath, cardiac arrhythmias, dizziness, blood pressure fluctuations, myocardial infarction, stroke/transient ischemic attack, and life-threatening complications (estimated to be 1 in 10,000)], benefits (risk stratification, diagnosing coronary artery disease, treatment guidance) and alternatives of an exercise tolerance test were discussed in detail with Jacob Chung and he agrees to proceed.

## 2022-03-21 NOTE — Progress Notes (Signed)
    ATTENDING ATTESTATION  I have seen, examined and evaluated the patient along with the Resident Physician Randa Evens Autry-Lott, DO) in clinic today.  I personally performed my own interview & exanimation.  After reviewing all the available data and chart, we discussed the patients laboratory, study & physical findings as well as symptoms in detail. I agree with her findings, examination as well as impression recommendations as per our discussion.    Attending adjustments int the full clinic noted annotated in italics.   Chest pain Chest pain, unspecified type x1 year intermittent at rest and with exertion. Unremarkable social hx. Fam Hx of HTN. BP appropriate today. LDL elevated. Will start with stress test. Discussed possible calcium score and/or CT angiogram depending on results. Plan for follow up pending initial work up.  - EKG 12-Lead - EXERCISE TOLERANCE TEST (ETT)  Otherwise healthy young man with no real cardiac risk factors.  He does have mildly elevated LDL which is a indeterminate risk at this point. Chest pain sounds relatively atypical.  Happens both at rest and with exertion is sharp only lasting couple minutes.  Not consistent.  No other symptoms and no family history. Low risk chest pain-agree with GXT/ETT.  Shared Decision Making/Informed Consent The risks [chest pain, shortness of breath, cardiac arrhythmias, dizziness, blood pressure fluctuations, myocardial infarction, stroke/transient ischemic attack, and life-threatening complications (estimated to be 1 in 10,000)], benefits (risk stratification, diagnosing coronary artery disease, treatment guidance) and alternatives of an exercise tolerance test were discussed in detail with Mr. Lampton and he agrees to proceed.    Bryan Lemma, M.D., M.S. Interventional Cardiologist   Pager # [phone] Phone # [phone] [address] [place], [place] [address]

## 2022-03-29 ENCOUNTER — Telehealth (HOSPITAL_COMMUNITY): Payer: Self-pay | Admitting: *Deleted

## 2022-03-29 ENCOUNTER — Ambulatory Visit (HOSPITAL_COMMUNITY)
Admission: RE | Admit: 2022-03-29 | Payer: BLUE CROSS/BLUE SHIELD | Source: Ambulatory Visit | Attending: Cardiology | Admitting: Cardiology

## 2022-03-29 NOTE — Telephone Encounter (Signed)
Close encounter 

## 2022-03-30 ENCOUNTER — Other Ambulatory Visit (HOSPITAL_COMMUNITY): Payer: Self-pay | Admitting: Cardiology

## 2022-03-30 ENCOUNTER — Ambulatory Visit (HOSPITAL_COMMUNITY)
Admission: RE | Admit: 2022-03-30 | Discharge: 2022-03-30 | Disposition: A | Discharge status: Home / Self Care | Payer: BC Managed Care – PPO | Source: Ambulatory Visit | Attending: Cardiovascular Disease | Admitting: Cardiovascular Disease

## 2022-03-30 DIAGNOSIS — R079 Chest pain, unspecified: Secondary | ICD-10-CM

## 2022-03-30 LAB — EXERCISE TOLERANCE TEST
Angina Index: 0
Estimated workload: 13.4
Estimated workload: 13.4
Exercise duration (min): 11 min
Exercise duration (min): 11 min
Exercise duration (sec): 42 s
Exercise duration (sec): 42 s
MPHR: 182 {beats}/min
MPHR: 182 {beats}/min
Peak HR: 187 {beats}/min
Peak HR: 187 {beats}/min
Percent HR: 102 %
Percent HR: 102 %
Rest HR: 80 {beats}/min
Rest HR: 80 {beats}/min

## 2022-04-09 ENCOUNTER — Telehealth: Payer: Self-pay | Admitting: *Deleted

## 2022-04-09 NOTE — Telephone Encounter (Signed)
Left second message for patient   no greeting on voice mail other than phone number   Test results is good .  Keep appointment . Result are released in  mychart  Any question may call office

## 2022-04-09 NOTE — Telephone Encounter (Signed)
-----   Message from Marykay Lex, MD sent at 04/01/2022  4:38 PM EDT ----- Treadmill stress test results were excellent Almost reached completed stage IV-stopped at 11 minutes: 42 seconds.  Excellent exercise capacity.  13.4 metabolic equivalents (METS). Able to reach 187 bpm which is over 100% of max predicted heart rate for age. No chest pain.  Stopping because of fatigue at this level is not unexpected. Blood pressure went up appropriately with exercise however the diastolic pressure probably went down.  This is normal. There were some artifact related EKG changes but nothing to suggest "ischemia ".  No abnormal arrhythmias.  VERY LOW RISK study.  This would significantly argue against chest pain was not cardiac in nature.  Bryan Lemma, MD

## 2022-04-16 ENCOUNTER — Telehealth: Payer: Self-pay | Admitting: Cardiology

## 2022-04-16 DIAGNOSIS — R0609 Other forms of dyspnea: Secondary | ICD-10-CM

## 2022-04-16 NOTE — Telephone Encounter (Signed)
Left a message for the patient to call back.   Marykay Lex, MD  04/01/2022  4:38 PM EDT     Treadmill stress test results were excellent Almost reached completed stage IV-stopped at 11 minutes: 42 seconds.  Excellent exercise capacity.  13.4 metabolic equivalents (METS). Able to reach 187 bpm which is over 100% of max predicted heart rate for age. No chest pain.  Stopping because of fatigue at this level is not unexpected. Blood pressure went up appropriately with exercise however the diastolic pressure probably went down.  This is normal. There were some artifact related EKG changes but nothing to suggest "ischemia ".  No abnormal arrhythmias.   VERY LOW RISK study.  This would significantly argue against chest pain was not cardiac in nature.   Bryan Lemma, MD

## 2022-04-16 NOTE — Telephone Encounter (Signed)
Patient is returning RN's call regarding stress test results. He states if the call is unable to be returned before 3:00 PM today, Thursday would be the best day to contact him. He states if calling after 3:00 PM today to leave a VM if he does not answer giving a round about time of when he can be called back on Thursday to ensure he is available.

## 2022-04-19 NOTE — Telephone Encounter (Signed)
  Pt is returning call, he said he will available to answer the phone between 1 pm - 5 pm today.

## 2022-04-19 NOTE — Telephone Encounter (Signed)
So a negative stress test pretty much answers the question about it being related to heart artery disease.  I had mentioned tests such as Coronary Calcium Score for risk stratification if the test is negative, or coronary CT angiogram if there is concerned about the stress test.  Coronary Calcium Score wall "answer question it would simply just be for restratification.  The CT angiogram would be a more definitive evaluation if the stress test was abnormal.  The other test we can do for shortness of breath is an echocardiogram which would likely also be normal but we can check 1 if he would like.  Again, if this is negative, we still have an answer for what this, causing his symptoms and would probably require noncardiac evaluation.   Bryan Lemma, MD

## 2022-04-19 NOTE — Telephone Encounter (Signed)
Notified pt of Dr Elissa Hefty result. Pt states that [facility] mentioned doing another test if this one did not have an "answer" for his symptoms. He states that he is still having increasing DOE while exercising(and after) and increased SOB while at rest(sitting and watching TV) that he has discussed with DH previously. He was wondering if he could have the "next test" now since there is nothing to explain his symptoms (he states that he is available to have this test done 05-03-22). He is available for telephone discussion today from 1-5pm and then tomorrow and Monday between 3-4pm.

## 2022-04-20 NOTE — Telephone Encounter (Signed)
I think echo would be best first.  Bryan Lemma, MD

## 2022-04-24 ENCOUNTER — Telehealth: Payer: Self-pay | Admitting: Cardiology

## 2022-04-24 DIAGNOSIS — R079 Chest pain, unspecified: Secondary | ICD-10-CM

## 2022-04-24 NOTE — Telephone Encounter (Signed)
He had a pretty low risk treadmill stress test.  He was able to go almost 11 minutes on the treadmill without any symptoms.  His quite unlikely that the chest pain he is having is cardiac in nature.  Lets see what the echocardiogram shows first.  I was thinking about a coronary calcium score but does not make any difference as far as his symptoms.  Is simply for screening.  Bryan Lemma, MD

## 2022-04-24 NOTE — Telephone Encounter (Signed)
Called patient, advised we would send a message over to Dr.Harding to see if he recommended to go ahead and get that CT completed. Patient states he would like to do it all at once if possible.

## 2022-04-24 NOTE — Telephone Encounter (Signed)
Pt is calling back about a status of him having a CT. Informed pt of last message from Dr. Herbie Baltimore about having an echo done first. Pt states he is available on next Thursday and would like to schedule asap as he does not have a lot of pto left on his job. He states he would a call between the hours of 3-4pm, which is his break and if no answer please leave a detailed message and he will call back.

## 2022-04-24 NOTE — Telephone Encounter (Signed)
Placed ordered for echo

## 2022-04-24 NOTE — Telephone Encounter (Signed)
Pt called and scheduled his echo for 05/10/22 at 2:00pm. He states he is really concerned and would like to have the CT done as well.  Informed him that I would contact nurse again. He states he is free on Thursday's. Please advise.

## 2022-04-25 NOTE — Telephone Encounter (Signed)
Called  patient . Patient states he cannot take at present time  - he at work  - he will be at lunch break-- please call after 3 pm on Friday July 28.2023   Pt verbalized understanding

## 2022-04-27 NOTE — Telephone Encounter (Signed)
Spoke to patient . Patient would like to proceed with scheduling ct calcium score   Only available 05/03/22 ( before 11 am or 05/10/22( after  1 pm preferably at 3  pm )  RN informed patient will given information to schedulers to contact him.

## 2022-04-30 DIAGNOSIS — K2900 Acute gastritis without bleeding: Secondary | ICD-10-CM | POA: Diagnosis not present

## 2022-05-10 ENCOUNTER — Ambulatory Visit (HOSPITAL_COMMUNITY): Payer: BC Managed Care – PPO | Attending: Cardiology

## 2022-05-10 ENCOUNTER — Ambulatory Visit (HOSPITAL_COMMUNITY): Payer: BLUE CROSS/BLUE SHIELD | Attending: Internal Medicine

## 2022-05-10 DIAGNOSIS — M5451 Vertebrogenic low back pain: Secondary | ICD-10-CM | POA: Diagnosis not present

## 2022-05-10 DIAGNOSIS — R0609 Other forms of dyspnea: Secondary | ICD-10-CM | POA: Diagnosis not present

## 2022-05-10 LAB — ECHOCARDIOGRAM COMPLETE
Area-P 1/2: 4.15 cm2
S' Lateral: 3.6 cm

## 2022-05-21 ENCOUNTER — Ambulatory Visit (HOSPITAL_BASED_OUTPATIENT_CLINIC_OR_DEPARTMENT_OTHER): Payer: BLUE CROSS/BLUE SHIELD

## 2022-05-31 ENCOUNTER — Ambulatory Visit (HOSPITAL_BASED_OUTPATIENT_CLINIC_OR_DEPARTMENT_OTHER)
Admission: RE | Admit: 2022-05-31 | Discharge: 2022-05-31 | Disposition: A | Discharge status: Home / Self Care | Payer: BLUE CROSS/BLUE SHIELD | Source: Ambulatory Visit | Attending: Cardiology | Admitting: Cardiology

## 2022-05-31 DIAGNOSIS — M5451 Vertebrogenic low back pain: Secondary | ICD-10-CM | POA: Diagnosis not present

## 2022-05-31 DIAGNOSIS — R079 Chest pain, unspecified: Secondary | ICD-10-CM | POA: Insufficient documentation

## 2022-06-19 ENCOUNTER — Telehealth: Payer: Self-pay | Admitting: *Deleted

## 2022-06-19 NOTE — Telephone Encounter (Signed)
Called  left message for patient to call back about rescheduling is 06/22/22 appointment

## 2022-06-22 ENCOUNTER — Ambulatory Visit: Payer: BLUE CROSS/BLUE SHIELD | Attending: Cardiology | Admitting: Cardiology

## 2022-06-25 ENCOUNTER — Telehealth: Payer: Self-pay

## 2022-06-25 NOTE — Telephone Encounter (Signed)
Patient came in to sch appt w Jacob Chung wanting to go over his test results.  Spoke w Ivin Booty and she had tried numerous times to get in touch by phone and [facility].  Called Patient and gave him time/date that was approved by Ivin Booty. 06-25-22 VB

## 2022-06-27 ENCOUNTER — Telehealth: Payer: Self-pay | Admitting: Cardiology

## 2022-06-27 NOTE — Telephone Encounter (Signed)
Patient called wanting an appt asap with Dr. Ellyn Hack. Told patient that Dr. Ellyn Hack doesn't have any availability. Patient already was rescheduled to 11/22. Patient said that there should be a way to fit in. Said that "Dr. Ellyn Hack cancelled on me, I didn't cancel on him." Patient would like for someone to get him a call back. Patient feels entitled to get appt asap

## 2022-06-27 NOTE — Telephone Encounter (Signed)
Left message on voicemail to call back   (  if patient  return call please contact RN  - unable to speak to patient when returning a call)   next available appointment is Nov 22 (latest appointment time) if patient can come earlier in the day at present time there are appointment on Oct 31, available.

## 2022-06-28 DIAGNOSIS — M5451 Vertebrogenic low back pain: Secondary | ICD-10-CM | POA: Diagnosis not present

## 2022-06-28 NOTE — Telephone Encounter (Signed)
Spoke to patient . Patient states he came to the office today - obtain an appointment with Diona Browner NP on 10 25/23 at 8 am

## 2022-07-25 ENCOUNTER — Ambulatory Visit: Payer: BLUE CROSS/BLUE SHIELD | Attending: Cardiology | Admitting: Nurse Practitioner

## 2022-07-25 ENCOUNTER — Encounter: Payer: Self-pay | Admitting: Nurse Practitioner

## 2022-07-25 VITALS — BP 126/82 | HR 95 | Ht 69.0 in | Wt 175.0 lb

## 2022-07-25 DIAGNOSIS — R0609 Other forms of dyspnea: Secondary | ICD-10-CM | POA: Diagnosis not present

## 2022-07-25 DIAGNOSIS — R079 Chest pain, unspecified: Secondary | ICD-10-CM

## 2022-07-25 NOTE — Patient Instructions (Signed)
Medication Instructions:  Your physician recommends that you continue on your current medications as directed. Please refer to the Current Medication list given to you today.  *If you need a refill on your cardiac medications before your next appointment, please call your pharmacy*   Lab Work: NONE ordered at this time of appointment   If you have labs (blood work) drawn today and your tests are completely normal, you will receive your results only by: MyChart Message (if you have MyChart) OR A paper copy in the mail If you have any lab test that is abnormal or we need to change your treatment, we will call you to review the results.   Testing/Procedures: NONE ordered at this time of appointment     Follow-Up: At [facility], you and your health needs are our priority.  As part of our continuing mission to provide you with exceptional heart care, we have created designated Provider Care Teams.  These Care Teams include your primary Cardiologist (physician) and Advanced Practice Providers (APPs -  Physician Assistants and Nurse Practitioners) who all work together to provide you with the care you need, when you need it.  We recommend signing up for the patient portal called "MyChart".  Sign up information is provided on this After Visit Summary.  MyChart is used to connect with patients for Virtual Visits (Telemedicine).  Patients are able to view lab/test results, encounter notes, upcoming appointments, etc.  Non-urgent messages can be sent to your provider as well.   To learn more about what you can do with MyChart, go to [email or web address].    Your next appointment:   5-6 month(s)  The format for your next appointment:   In Person  Provider:   David Harding, MD     Other Instructions   Important Information About Sugar       

## 2022-07-25 NOTE — Progress Notes (Addendum)
Office Visit    Patient Name: Jacob Chung Date of Encounter: 07/25/2022  Primary Care Provider:  Leighton Ruff, MD (Inactive) Primary Cardiologist:  Glenetta Hew, MD  Chief Complaint    36 year old male with a history of chest pain, shortness of breath, and seasonal allergies who presents for follow-up related to chest pain and shortness of breath.  Past Medical History    Past Medical History:  Diagnosis Date   Seasonal allergies    Thrombocytopenia (HCC)    Past Surgical History:  Procedure Laterality Date   APPENDECTOMY      Allergies  No Known Allergies  History of Present Illness    36 year old male with the above past medical history including chest pain, shortness of breath, and seasonal allergies.   He was referred to Dr. Ellyn Hack in June 2023 evaluation of intermittent chest pain that occurred with exertion and at rest, shortness of breath both at rest and with exertion.  He was last seen in the office on 03/19/2022 and reported near history of intermittent chest pain both at rest and with exertion, occasional tingling in his left arm, is dyspnea on exertion and occasionally at rest.  EKG was unremarkable.  ETT was low risk and showed excellent exercise capacity.  Echocardiogram revealed EF 65 to 70%, normal LV function, no RWMA, normal RV systolic function, no significant valvular abnormalities.  Coronary calcium score revealed calcium score of 0.   He presents today for follow-up.  Since his last visit been stable from a cardiac standpoint.  He still notes occasional sharp fleeting pain in his chest that occurs both at rest and with activity. He does note some improvement in his symptoms overall.  He is reassured by the results of his recent testing.  Other than his ongoing occasional chest discomfort, he denies any additional concerns today.  Home Medications    Current Outpatient Medications  Medication Sig Dispense Refill   fluticasone (FLONASE ALLERGY  RELIEF) 50 MCG/ACT nasal spray 1 spray in each nostril     loratadine (CLARITIN) 10 MG tablet 1 tablet     No current facility-administered medications for this visit.     Review of Systems    He denies palpitations, dyspnea, pnd, orthopnea, n, v, dizziness, syncope, edema, weight gain, or early satiety. All other systems reviewed and are otherwise negative except as noted above.   Physical Exam    VS:  BP 126/82   Pulse 95   Ht 5\' 9"  (1.753 m)   Wt 175 lb (79.4 kg)   SpO2 96%   BMI 25.84 kg/m   GEN: Well nourished, well developed, in no acute distress. HEENT: normal. Neck: Supple, no JVD, carotid bruits, or masses. Cardiac: RRR, no murmurs, rubs, or gallops. No clubbing, cyanosis, edema.  Radials/DP/PT 2+ and equal bilaterally.  Respiratory:  Respirations regular and unlabored, clear to auscultation bilaterally. GI: Soft, nontender, nondistended, BS + x 4. MS: no deformity or atrophy. Skin: warm and dry, no rash. Neuro:  Strength and sensation are intact. Psych: Normal affect.  Accessory Clinical Findings    ECG personally reviewed by me today - No EKG in office today.    Lab Results  Component Value Date   WBC 9.8 02/29/2016   HGB 15.1 02/29/2016   HCT 44.2 02/29/2016   MCV 94.0 02/29/2016   PLT 157 02/29/2016   Lab Results  Component Value Date   CREATININE 1.02 02/29/2016   BUN 10 02/29/2016   NA 140 02/29/2016   K  4.5 02/29/2016   CL 106 02/29/2016   CO2 26 02/29/2016   Lab Results  Component Value Date   ALT 45 02/29/2016   AST 32 02/29/2016   ALKPHOS 80 02/29/2016   BILITOT 1.0 02/29/2016   No results found for: "CHOL", "HDL", "LDLCALC", "LDLDIRECT", "TRIG", "CHOLHDL"  No results found for: "HGBA1C"  Assessment & Plan    1. Chest pain/shortness of breath: He has a greater than 1 year history of intermittent fleeting sharp chest pain, occasional dyspnea on exertion.  Symptoms last for approximately a minute or less at a time.  They have decreased  in frequency over the past several months.  He had a normal ETT, echocardiogram was normal, coronary calcium scoring revealed calcium score of 0.  Overall reassuring work-up to date.  I advised him that should he notice an increased frequency or severity in his symptoms, to contact us and we could consider coronary CT angiogram at that time.  Symptoms are atypical overall.  Likely musculoskeletal in nature, we discussed possible component of anxiety.  I do not think further testing is warranted at this time.  Discussed with Dr. Herbie Baltimore, who agrees.  BP is well controlled.  No significant family history of CAD.  Encouraged ongoing lifestyle modifications with diet and exercise.   2. Disposition: Follow-up in 5 to 6 months with Dr. Herbie Baltimore.     Joylene Grapes, NP 07/25/2022, 8:40 AM

## 2022-08-22 ENCOUNTER — Ambulatory Visit: Payer: BLUE CROSS/BLUE SHIELD | Admitting: Cardiology

## 2022-10-20 DIAGNOSIS — K529 Noninfective gastroenteritis and colitis, unspecified: Secondary | ICD-10-CM | POA: Diagnosis not present

## 2022-10-20 DIAGNOSIS — Z202 Contact with and (suspected) exposure to infections with a predominantly sexual mode of transmission: Secondary | ICD-10-CM | POA: Diagnosis not present

## 2022-10-28 ENCOUNTER — Ambulatory Visit (HOSPITAL_COMMUNITY)
Admission: EM | Admit: 2022-10-28 | Discharge: 2022-10-28 | Disposition: A | Discharge status: Home / Self Care | Payer: BLUE CROSS/BLUE SHIELD | Attending: Emergency Medicine | Admitting: Emergency Medicine

## 2022-10-28 ENCOUNTER — Encounter (HOSPITAL_COMMUNITY): Payer: Self-pay | Admitting: *Deleted

## 2022-10-28 ENCOUNTER — Ambulatory Visit
Admission: EM | Admit: 2022-10-28 | Discharge: 2022-10-28 | Disposition: A | Discharge status: Home / Self Care | Payer: BC Managed Care – PPO | Attending: Nurse Practitioner | Admitting: Nurse Practitioner

## 2022-10-28 ENCOUNTER — Other Ambulatory Visit: Payer: Self-pay

## 2022-10-28 DIAGNOSIS — U071 COVID-19: Secondary | ICD-10-CM | POA: Diagnosis not present

## 2022-10-28 DIAGNOSIS — B349 Viral infection, unspecified: Secondary | ICD-10-CM | POA: Diagnosis not present

## 2022-10-28 DIAGNOSIS — R112 Nausea with vomiting, unspecified: Secondary | ICD-10-CM

## 2022-10-28 DIAGNOSIS — R6889 Other general symptoms and signs: Secondary | ICD-10-CM

## 2022-10-28 LAB — POC INFLUENZA A AND B ANTIGEN (URGENT CARE ONLY)
Influenza A Ag: NEGATIVE
Influenza B Ag: NEGATIVE

## 2022-10-28 MED ORDER — ONDANSETRON 4 MG PO TBDP
4.0000 mg | ORAL_TABLET | Freq: Once | ORAL | Status: AC
  Administered 2022-10-28: 4 mg via ORAL

## 2022-10-28 MED ORDER — ONDANSETRON HCL 4 MG PO TABS: 4.0000 mg | ORAL_TABLET | Freq: Four times a day (QID) | ORAL | 0 refills | Status: AC

## 2022-10-28 NOTE — ED Triage Notes (Signed)
Pt states Thursday he began vomiting and now has nausea.   Home interventions: pepto bismol

## 2022-10-28 NOTE — Discharge Instructions (Addendum)
Your COVID test is currently in process, we will call you if any of your test results are positive.   I suggest that you pick up the prescription for Zofran and begin taking.  You will want to adhere to a bland diet and increase fluid intake, you should be drinking at least 8 cups of water daily.  As discussed, I think would be helpful for you to use over-the-counter medications to help with symptom management.  Viral illnesses can take 7 to 10 days before your symptoms begin resolving.   If your symptoms are not improving you may return for follow-up.

## 2022-10-28 NOTE — ED Triage Notes (Signed)
Pt reports he has been vomiting Thur,Friday and last night . Pt needs a FLU test to return to work.

## 2022-10-28 NOTE — ED Notes (Addendum)
Spoke to patient in lobby.  Presented option of going to [facility] for test.  Again, told patient that a provider would examine patient, but testing was not a guarantee.  Testing would be based on providers discretion.

## 2022-10-28 NOTE — Discharge Instructions (Signed)
Zofran as needed for nausea/vomiting. The clinic will contact you with results of your COVID testing is positive Brat diet (bread, rice, applesauce, toast, bananas) and advance as tolerated Stay hydrated with Gatorade, Powerade, Pedialyte, water Follow-up with your PCP in 2 to 3 days for recheck Please go to emergency room if you have any worsening symptoms

## 2022-10-28 NOTE — ED Provider Notes (Signed)
[facility]    CSN: [id number] Arrival date & time: 10/28/22  1249      History   Chief Complaint Chief Complaint  Patient presents with   requesting flu test    HPI Jacob Chung is a 37 y.o. male.  Patient presents requesting influenza testing.  Patient reports that he has had emesis, nasal congestion, and productive cough that started 4 days ago.  He reports that he was seen today at [facility] health urgent care at [facility], he reports that they were unable to do influenza testing due to low supplies.   He reports that he had a COVID 19 test performed today which is pending.  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  Patient reports that he has held off on taking any symptom management medications because he wants to know the definitive answer to his symptoms before taking any medications.   He denies any chronic health problems. He does report a history of seasonal allergies.  HPI  Past Medical History:  Diagnosis Date   Seasonal allergies    Thrombocytopenia [facility])     Patient Active Problem List   Diagnosis Date Noted   Chest pain 03/21/2022    Past Surgical History:  Procedure Laterality Date   APPENDECTOMY         Home Medications    Prior to Admission medications   Medication Sig Start Date End Date Taking? Authorizing Provider  fluticasone (FLONASE ALLERGY RELIEF) 50 MCG/ACT nasal spray 1 spray in each nostril    [provider]  loratadine (CLARITIN) 10 MG tablet 1 tablet    [provider]    Family History Family History  Problem Relation Age of Onset   Healthy Mother    Hypertension Mother    Healthy Father    Healthy Sister    Healthy Sister    Healthy Brother    COPD Maternal Grandmother        Smoker   Hypertension Maternal Grandmother    Pancreatic cancer Paternal  Grandmother    Hypertension Other    Coronary artery disease Neg Hx    Heart failure Neg Hx     Social History Social History   Tobacco Use   Smoking status: Never   Smokeless tobacco: Never  Vaping Use   Vaping Use: Never used  Substance Use Topics   Alcohol use: Yes    Comment: 3-4 drinks a week   Drug use: No     Allergies   Patient has no known allergies.   Review of Systems Review of Systems  Constitutional:  Positive for activity change, appetite change and fatigue. Negative for chills and fever.  HENT:  Positive for congestion and rhinorrhea. Negative for ear discharge, ear pain, postnasal drip, sinus pressure, sinus pain, sore throat and trouble swallowing.   Eyes: Negative.   Respiratory:  Positive for cough. Negative for chest tightness, shortness of breath and wheezing.   Cardiovascular:  Negative for chest pain and palpitations.  Gastrointestinal:  Positive for nausea and vomiting. Negative for abdominal distention, abdominal pain, blood in stool, constipation and diarrhea.  Musculoskeletal:  Positive for myalgias.     Physical Exam Triage Vital Signs ED Triage Vitals  Enc Vitals Group     BP 10/28/22 1416 (!) 125/101     Pulse Rate 10/28/22 1416  83     Resp 10/28/22 1443 16     Temp 10/28/22 1416 98.6 F (37 C)     Temp src --      SpO2 10/28/22 1416 98 %     Weight --      Height --      Head Circumference --      Peak Flow --      Pain Score 10/28/22 1414 5     Pain Loc --      Pain Edu? --      Excl. in [place]? --    No data found.  Updated Vital Signs BP (!) 125/101   Pulse 83   Temp 98.6 F (37 C)   Resp 16   SpO2 98%     Physical Exam Vitals and nursing note reviewed.  Constitutional:      Appearance: Normal appearance.  HENT:     Right Ear: Hearing, tympanic membrane and ear canal normal.     Left Ear: Hearing, tympanic membrane, ear canal and external ear normal.     Nose: Rhinorrhea present. No congestion. Rhinorrhea is  clear.     Right Turbinates: Enlarged and swollen. Not pale.     Left Turbinates: Enlarged and swollen. Not pale.     Right Sinus: No maxillary sinus tenderness or frontal sinus tenderness.     Left Sinus: No maxillary sinus tenderness or frontal sinus tenderness.     Mouth/Throat:     Mouth: Mucous membranes are moist.     Pharynx: No pharyngeal swelling, oropharyngeal exudate, posterior oropharyngeal erythema or uvula swelling.     Tonsils: No tonsillar exudate or tonsillar abscesses. 0 on the right. 0 on the left.  Cardiovascular:     Rate and Rhythm: Normal rate and regular rhythm.     Heart sounds: Normal heart sounds, S1 normal and S2 normal.  Pulmonary:     Effort: Pulmonary effort is normal.     Breath sounds: Normal breath sounds and air entry. No decreased breath sounds, wheezing, rhonchi or rales.  Abdominal:     General: Abdomen is flat. Bowel sounds are normal. There is no distension. There are no signs of injury.     Palpations: Abdomen is soft. There is no shifting dullness, fluid wave, hepatomegaly, splenomegaly, mass or pulsatile mass.     Tenderness: There is generalized abdominal tenderness. There is no right CVA tenderness, left CVA tenderness, guarding or rebound. Negative signs include Murphy's sign and McBurney's sign.  Lymphadenopathy:     Cervical: No cervical adenopathy.  Neurological:     Mental Status: He is alert.      UC Treatments / Results  Labs (all labs ordered are listed, but only abnormal results are displayed) Labs Reviewed  POC INFLUENZA A AND B ANTIGEN (URGENT CARE ONLY)    EKG   Radiology No results found.  Procedures Procedures (including critical care time)  Medications Ordered in UC Medications - No data to display  Initial Impression / Assessment and Plan / UC Course  I have reviewed the triage vital signs and the nursing notes.  Pertinent labs & imaging results that were available during my care of the patient were reviewed  by me and considered in my medical decision making (see chart for details).     Patient was evaluated for viral illness and flulike symptoms.  POC influenza was negative.  COVID test pending from prior visit earlier today.  Patient was educated on the possibility of  still having influenza especially with a high false-negative rate and point-of-care influenza testing, he was made aware that currently PCR Influenza testing is unavailable at this clinic due to national shortage.  Patient was educated that Tamiflu prescription would not be optimal during this time, due to efficacy and timeline of symptoms.  Patient was educated on symptom management of a viral illness with over-the-counter medication regiment.  He was made aware of mask precautions and distancing from family members.  Patient states that he did not have a Zofran prescription sent to the pharmacy and requested a prescription be sent.  This Probation officer sent Zofran prescription to the pharmacy and educated patient on medication regiment.  Patient was made aware of timeline for symptom resolution.  Work note was given.  Patient was made aware of results reporting protocol and MyChart.  Patient verbalized understanding of instructions.  Charting was provided using a a verbal dictation system, charting was proofread for errors, errors may occur which could change the meaning of the information charted.   Final Clinical Impressions(s) / UC Diagnoses   Final diagnoses:  Viral illness  Flu-like symptoms     Discharge Instructions      Your COVID test is currently in process, we will call you if any of your test results are positive.   I suggest that you pick up the prescription for Zofran and begin taking.  You will want to adhere to a bland diet and increase fluid intake, you should be drinking at least 8 cups of water daily.  As discussed, I think would be helpful for you to use over-the-counter medications to help with symptom  management.  Viral illnesses can take 7 to 10 days before your symptoms begin resolving.   If your symptoms are not improving you may return for follow-up.      ED Prescriptions   None    PDMP not reviewed this encounter.   Flossie Dibble, NP 10/28/22 [phone]

## 2022-10-28 NOTE — ED Notes (Signed)
Spoke to provider staff in regards to patient.  Spoke to patient in the in-take room.  Patient stressed the importance of getting a flu test.  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  Patient felt he could walk in and get a flu test and leave after being seen at [facility].  Explained to patient that he would have to be seen by a provider at this site and test only ordered at providers discretion.  Did tell patient that if he had any illness symptoms, he did not need to be with elderly family members whether flu or not flu.  Patient aware of wait time .  Patient asked if process any quicker in ED, discouraged this option.  Assured patient we would be glad to see him.

## 2022-10-28 NOTE — ED Provider Notes (Signed)
[facility]    CSN: [id number] Arrival date & time: 10/28/22  1142      History   Chief Complaint Chief Complaint  Patient presents with   Vomiting   Nausea    HPI Jacob Chung is a 37 y.o. male who presents for evaluation of URI symptoms for 4 days. Patient reports associated symptoms of nausea/vomiting, cough, congestion, subjective fevers with chills. Denies diarrhea, sore throat, body aches, shortness of breath, ear pain.  Patient does not have a hx of asthma or smoking. No known sick contacts and no recent travel. Pt is not vaccinated for COVID. Pt is not vaccinated for flu this season. Pt has taken pepto  OTC for symptoms. He was seen at another walk in clini on 1/20 for N/V. Was diagnosed with gastroenteritis and prescribed Zofran.  He has not picked up the Zofran.  He states his symptoms have completely resolved and when he is currently experiencing it is a new set of symptoms.  Pt has no other concerns at this time.   HPI  Past Medical History:  Diagnosis Date   Seasonal allergies    Thrombocytopenia [facility])     Patient Active Problem List   Diagnosis Date Noted   Chest pain 03/21/2022    Past Surgical History:  Procedure Laterality Date   APPENDECTOMY         Home Medications    Prior to Admission medications   Medication Sig Start Date End Date Taking? Authorizing Provider  fluticasone (FLONASE ALLERGY RELIEF) 50 MCG/ACT nasal spray 1 spray in each nostril    [provider]  loratadine (CLARITIN) 10 MG tablet 1 tablet    [provider]    Family History Family History  Problem Relation Age of Onset   Healthy Mother    Hypertension Mother    Healthy Father    Healthy Sister    Healthy Sister    Healthy Brother    COPD Maternal Grandmother        Smoker   Hypertension Maternal Grandmother    Pancreatic cancer Paternal Grandmother    Hypertension Other    Coronary artery disease Neg Hx    Heart failure Neg Hx      Social History Social History   Tobacco Use   Smoking status: Never   Smokeless tobacco: Never  Vaping Use   Vaping Use: Never used  Substance Use Topics   Alcohol use: Yes    Comment: 3-4 drinks a week   Drug use: No     Allergies   Patient has no known allergies.   Review of Systems Review of Systems  Constitutional:  Positive for chills.  HENT:  Positive for congestion.   Respiratory:  Positive for cough.   Gastrointestinal:  Positive for nausea and vomiting.     Physical Exam Triage Vital Signs ED Triage Vitals [10/28/22 1156]  Enc Vitals Group     BP 113/82     Pulse Rate 90     Resp 16     Temp 98.5 F (36.9 C)     Temp Source Oral     SpO2 96 %     Weight      Height      Head Circumference      Peak Flow      Pain Score 0     Pain Loc      Pain Edu?      Excl. in [place]?    No data  found.  Updated Vital Signs BP 113/82 (BP Location: Left Arm)   Pulse 90   Temp 98.5 F (36.9 C) (Oral)   Resp 16   SpO2 96%   Visual Acuity Right Eye Distance:   Left Eye Distance:   Bilateral Distance:    Right Eye Near:   Left Eye Near:    Bilateral Near:     Physical Exam Vitals and nursing note reviewed.  Constitutional:      General: He is not in acute distress.    Appearance: Normal appearance. He is not ill-appearing or toxic-appearing.  HENT:     Head: Normocephalic and atraumatic.     Right Ear: Tympanic membrane and ear canal normal.     Left Ear: Tympanic membrane and ear canal normal.     Nose: Congestion present.     Mouth/Throat:     Mouth: Mucous membranes are moist.     Pharynx: No oropharyngeal exudate or posterior oropharyngeal erythema.  Eyes:     Pupils: Pupils are equal, round, and reactive to light.  Cardiovascular:     Rate and Rhythm: Normal rate and regular rhythm.     Heart sounds: Normal heart sounds.  Pulmonary:     Effort: Pulmonary effort is normal.     Breath sounds: Normal breath sounds.  Abdominal:      General: Bowel sounds are normal.     Palpations: Abdomen is soft.     Tenderness: There is generalized abdominal tenderness. Negative signs include Rovsing's sign and McBurney's sign.  Musculoskeletal:     Cervical back: Normal range of motion and neck supple.  Lymphadenopathy:     Cervical: No cervical adenopathy.  Skin:    General: Skin is warm and dry.  Neurological:     General: No focal deficit present.     Mental Status: He is alert and oriented to person, place, and time.  Psychiatric:        Mood and Affect: Mood normal.        Behavior: Behavior normal.      UC Treatments / Results  Labs (all labs ordered are listed, but only abnormal results are displayed) Labs Reviewed  SARS CORONAVIRUS 2 (TAT 6-24 HRS)    EKG   Radiology No results found.  Procedures Procedures (including critical care time)  Medications Ordered in UC Medications  ondansetron (ZOFRAN-ODT) disintegrating tablet 4 mg (has no administration in time range)    Initial Impression / Assessment and Plan / UC Course  I have reviewed the triage vital signs and the nursing notes.  Pertinent labs & imaging results that were available during my care of the patient were reviewed by me and considered in my medical decision making (see chart for details).     Reviewed exam and symptoms with patient.  No red flags on exam. Discussed influenza-like symptoms.  Reviewed symptom management COVID PCR and will contact if positive Patient has a prescription for Zofran waiting from the pharmacy and will pick up.  He was given a dose in clinic for his nausea Brat diet advance as tolerated Rest and fluids PCP follow-up 2 to 3 days for recheck ER precautions reviewed and patient verbalized understanding Final Clinical Impressions(s) / UC Diagnoses   Final diagnoses:  Nausea and vomiting, unspecified vomiting type  Flu-like symptoms     Discharge Instructions      Zofran as needed for  nausea/vomiting. The clinic will contact you with results of your COVID testing is positive Brat  diet (bread, rice, applesauce, toast, bananas) and advance as tolerated Stay hydrated with Gatorade, Powerade, Pedialyte, water Follow-up with your PCP in 2 to 3 days for recheck Please go to emergency room if you have any worsening symptoms   ED Prescriptions   None    PDMP not reviewed this encounter.   Melynda Ripple, NP 10/28/22 [phone]

## 2022-10-29 LAB — SARS CORONAVIRUS 2 (TAT 6-24 HRS): SARS Coronavirus 2: POSITIVE — AB

## 2022-10-30 ENCOUNTER — Ambulatory Visit
Admission: EM | Admit: 2022-10-30 | Discharge: 2022-10-30 | Disposition: A | Discharge status: Home / Self Care | Payer: BC Managed Care – PPO | Attending: Urgent Care | Admitting: Urgent Care

## 2022-10-30 ENCOUNTER — Ambulatory Visit (INDEPENDENT_AMBULATORY_CARE_PROVIDER_SITE_OTHER): Payer: BLUE CROSS/BLUE SHIELD

## 2022-10-30 DIAGNOSIS — R059 Cough, unspecified: Secondary | ICD-10-CM | POA: Diagnosis not present

## 2022-10-30 DIAGNOSIS — U071 COVID-19: Secondary | ICD-10-CM

## 2022-10-30 MED ORDER — PSEUDOEPHEDRINE HCL 30 MG PO TABS: 30.0000 mg | ORAL_TABLET | Freq: Three times a day (TID) | ORAL | 0 refills | Status: AC | PRN

## 2022-10-30 MED ORDER — PROMETHAZINE-DM 6.25-15 MG/5ML PO SYRP: 5.0000 mL | ORAL_SOLUTION | Freq: Three times a day (TID) | ORAL | 0 refills | Status: AC | PRN

## 2022-10-30 MED ORDER — BENZONATATE 100 MG PO CAPS: 100.0000 mg | ORAL_CAPSULE | Freq: Three times a day (TID) | ORAL | 0 refills | Status: AC | PRN

## 2022-10-30 MED ORDER — CETIRIZINE HCL 10 MG PO TABS: 10.0000 mg | ORAL_TABLET | Freq: Every day | ORAL | 0 refills | Status: AC

## 2022-10-30 NOTE — Discharge Instructions (Addendum)
For sore throat or cough try using a honey-based tea. Use 3 teaspoons of honey with juice squeezed from half lemon. Place shaved pieces of ginger into 1/2-1 cup of water and warm over stove top. Then mix the ingredients and repeat every 4 hours as needed. Please take ibuprofen 600mg  every 6 hours with food alternating with OR taken together with Tylenol 500mg -650mg  every 6 hours for throat pain, fevers, aches and pains. Hydrate very well with at least 2 liters of water. Eat light meals such as soups (chicken and noodles, vegetable, chicken and wild rice).  Do not eat foods that you are allergic to.  Taking an antihistamine like Zyrtec can help against postnasal drainage, sinus congestion which can cause sinus pain, sinus headaches, throat pain, painful swallowing, coughing.  You can take this together with pseudoephedrine (Sudafed) at a dose of 30 mg 3 times a day or twice daily as needed for the same kind of nasal drip, congestion. Use the cough medications as needed.

## 2022-10-30 NOTE — ED Triage Notes (Signed)
Pt states he had +covid test 1/28-states he was advised he should have CXR when result was called-NAD-steady gait

## 2022-10-30 NOTE — ED Provider Notes (Signed)
[facility]  Note:  This document was prepared using Systems analyst and may include unintentional dictation errors.  MRN: [id number] DOB: 1986-04-27  Subjective:   Jacob Chung is a 37 y.o. male presenting for recheck on his COVID-19 infection.  Patient still reports significant malaise, fatigue, body pains.  He is coughing as well.  Tested positive for COVID-19 10/28/2022.  He did not want COVID antiviral treatment.  No history of respiratory disorders.  No smoking, vaping, marijuana use.  No current facility-administered medications for this encounter.  Current Outpatient Medications:    fluticasone (FLONASE ALLERGY RELIEF) 50 MCG/ACT nasal spray, 1 spray in each nostril, Disp: , Rfl:    loratadine (CLARITIN) 10 MG tablet, 1 tablet, Disp: , Rfl:    ondansetron (ZOFRAN) 4 MG tablet, Take 1 tablet (4 mg total) by mouth every 6 (six) hours., Disp: 28 tablet, Rfl: 0   No Known Allergies  Past Medical History:  Diagnosis Date   Seasonal allergies    Thrombocytopenia (HCC)      Past Surgical History:  Procedure Laterality Date   APPENDECTOMY      Family History  Problem Relation Age of Onset   Healthy Mother    Hypertension Mother    Healthy Father    Healthy Sister    Healthy Sister    Healthy Brother    COPD Maternal Grandmother        Smoker   Hypertension Maternal Grandmother    Pancreatic cancer Paternal Grandmother    Hypertension Other    Coronary artery disease Neg Hx    Heart failure Neg Hx     Social History   Tobacco Use   Smoking status: Never   Smokeless tobacco: Never  Vaping Use   Vaping Use: Never used  Substance Use Topics   Alcohol use: Yes    Comment: 3-4 drinks a week   Drug use: No    ROS   Objective:   Vitals: BP (!) 136/92 (BP Location: Left Arm)   Pulse (!) 104   Temp 98.6 F (37 C) (Oral)   Resp 20   SpO2 96%   Physical Exam Constitutional:      General: He is not in acute  distress.    Appearance: Normal appearance. He is well-developed. He is not ill-appearing, toxic-appearing or diaphoretic.  HENT:     Head: Normocephalic and atraumatic.     Right Ear: External ear normal.     Left Ear: External ear normal.     Nose: Nose normal.     Mouth/Throat:     Mouth: Mucous membranes are moist.  Eyes:     General: No scleral icterus.       Right eye: No discharge.        Left eye: No discharge.     Extraocular Movements: Extraocular movements intact.  Cardiovascular:     Rate and Rhythm: Normal rate and regular rhythm.     Heart sounds: Normal heart sounds. No murmur heard.    No friction rub. No gallop.  Pulmonary:     Effort: Pulmonary effort is normal. No respiratory distress.     Breath sounds: Normal breath sounds. No stridor. No wheezing, rhonchi or rales.  Neurological:     Mental Status: He is alert and oriented to person, place, and time.  Psychiatric:        Mood and Affect: Mood normal.        Behavior: Behavior normal.  Thought Content: Thought content normal.    Recent Results (from the past 2160 hour(s))  SARS CORONAVIRUS 2 (TAT 6-24 HRS) Anterior Nasal Swab     Status: Abnormal   Collection Time: 10/28/22 12:18 PM   Specimen: Anterior Nasal Swab  Result Value Ref Range   SARS Coronavirus 2 POSITIVE (A) NEGATIVE    Comment: (NOTE) SARS-CoV-2 target nucleic acids are DETECTED.  The SARS-CoV-2 RNA is generally detectable in upper and lower respiratory specimens during the acute phase of infection. Positive results are indicative of the presence of SARS-CoV-2 RNA. Clinical correlation with patient history and other diagnostic information is  necessary to determine patient infection status. Positive results do not rule out bacterial infection or co-infection with other viruses.  The expected result is Negative.  Fact Sheet for Patients: SugarRoll.be  Fact Sheet for Healthcare  Providers: https://www.woods-mathews.com/  This test is not yet approved or cleared by the [place] FDA and  has been authorized for detection and/or diagnosis of SARS-CoV-2 by FDA under an Emergency Use Authorization (EUA). This EUA will remain  in effect (meaning this test can be used) for the duration of the COVID-19 declaration under Section 564(b)(1) of the Act, 21 U. S.C. section 360bbb-3(b)(1), unless the authorization is terminated or revoked sooner.   Performed at [facility], [place] [address] [place], [place] [address]   POC Influenza A & B Ag (Urgent Care)     Status: None   Collection Time: 10/28/22  2:59 PM  Result Value Ref Range   Influenza A Ag Negative Negative   Influenza B Ag Negative Negative    Assessment and Plan :   PDMP not reviewed this encounter.  1. COVID-19 virus infection     X-ray over-read was pending at time of discharge, recommended follow up with only abnormal results. Otherwise will not call for negative over-read. Patient was in agreement.  Patient has borderline tachycardia.  Otherwise, he is hemodynamically stable.  Has clear cardiopulmonary exam.  Recommended supportive care.  Will update treatment plan as necessary following the over read. Counseled patient on potential for adverse effects with medications prescribed/recommended today, ER and return-to-clinic precautions discussed, patient verbalized understanding.    Jaynee Eagles, PA-C 10/30/22 1253

## 2022-12-03 ENCOUNTER — Other Ambulatory Visit: Payer: Self-pay

## 2022-12-03 ENCOUNTER — Emergency Department (HOSPITAL_COMMUNITY)
Admission: EM | Admit: 2022-12-03 | Discharge: 2022-12-03 | Disposition: A | Discharge status: Home / Self Care | Payer: BLUE CROSS/BLUE SHIELD | Attending: Emergency Medicine | Admitting: Emergency Medicine

## 2022-12-03 ENCOUNTER — Other Ambulatory Visit (HOSPITAL_COMMUNITY): Payer: Self-pay

## 2022-12-03 DIAGNOSIS — Z202 Contact with and (suspected) exposure to infections with a predominantly sexual mode of transmission: Secondary | ICD-10-CM | POA: Insufficient documentation

## 2022-12-03 DIAGNOSIS — Z206 Contact with and (suspected) exposure to human immunodeficiency virus [HIV]: Secondary | ICD-10-CM

## 2022-12-03 LAB — COMPREHENSIVE METABOLIC PANEL
ALT: 30 U/L (ref 0–44)
AST: 32 U/L (ref 15–41)
Albumin: 4.4 g/dL (ref 3.5–5.0)
Alkaline Phosphatase: 79 U/L (ref 38–126)
Anion gap: 9 (ref 5–15)
BUN: 8 mg/dL (ref 6–20)
CO2: 26 mmol/L (ref 22–32)
Calcium: 9.2 mg/dL (ref 8.9–10.3)
Chloride: 103 mmol/L (ref 98–111)
Creatinine, Ser: 1.02 mg/dL (ref 0.61–1.24)
GFR, Estimated: >60 mL/min (ref >60)
Glucose, Bld: 99 mg/dL (ref 70–99)
Potassium: 4.1 mmol/L (ref 3.5–5.1)
Sodium: 138 mmol/L (ref 135–145)
Total Bilirubin: 1 mg/dL (ref 0.3–1.2)
Total Protein: 7.3 g/dL (ref 6.5–8.1)

## 2022-12-03 LAB — CBC WITH DIFFERENTIAL/PLATELET
Abs Immature Granulocytes: 0.01 10*3/uL (ref 0.00–0.07)
Basophils Absolute: 0 10*3/uL (ref 0.0–0.1)
Basophils Relative: 0 %
Eosinophils Absolute: 0 10*3/uL (ref 0.0–0.5)
Eosinophils Relative: 0 %
HCT: 40.5 % (ref 39.0–52.0)
Hemoglobin: 13.2 g/dL (ref 13.0–17.0)
Immature Granulocytes: 0 %
Lymphocytes Relative: 10 %
Lymphs Abs: 0.8 10*3/uL (ref 0.7–4.0)
MCH: 33.2 pg (ref 26.0–34.0)
MCHC: 32.6 g/dL (ref 30.0–36.0)
MCV: 102 fL — ABNORMAL HIGH (ref 80.0–100.0)
Monocytes Absolute: 0.4 10*3/uL (ref 0.1–1.0)
Monocytes Relative: 5 %
Neutro Abs: 6.5 10*3/uL (ref 1.7–7.7)
Neutrophils Relative %: 85 %
Platelets: 152 10*3/uL (ref 150–400)
RBC: 3.97 MIL/uL — ABNORMAL LOW (ref 4.22–5.81)
RDW: 12.9 % (ref 11.5–15.5)
WBC: 7.8 10*3/uL (ref 4.0–10.5)
nRBC: 0 % (ref 0.0–0.2)

## 2022-12-03 MED ORDER — ISENTRESS 400 MG PO TABS: 400.0000 mg | ORAL_TABLET | Freq: Two times a day (BID) | ORAL | 0 refills | Status: DC

## 2022-12-03 MED ORDER — RALTEGRAVIR POTASSIUM 400 MG PREPACK
1.0000 | ORAL_TABLET | Freq: Once | ORAL | Status: AC
  Administered 2022-12-03: 1 via ORAL
  Filled 2022-12-03 (×3): qty 1

## 2022-12-03 MED ORDER — EMTRICITABINE-TENOFOVIR DF 200-300 MG PO PREPACK
1.0000 | ORAL_TABLET | Freq: Once | ORAL | Status: AC
  Administered 2022-12-03: 1 via ORAL
  Filled 2022-12-03 (×3): qty 1

## 2022-12-03 MED ORDER — EMTRICITABINE-TENOFOVIR DF 200-300 MG PO TABS: 1.0000 | ORAL_TABLET | Freq: Every day | ORAL | 0 refills | Status: DC

## 2022-12-03 MED ORDER — EMTRICITABINE-TENOFOVIR DF 200-300 MG PO TABS
1.0000 | ORAL_TABLET | Freq: Every day | ORAL | 0 refills | Status: AC
  Filled 2022-12-04: qty 30, 30d supply, fill #0

## 2022-12-03 NOTE — ED Triage Notes (Signed)
Pt requesting STD check after having sex with girl that was positive for HIV but was undetectable. Sent here from the health department.

## 2022-12-03 NOTE — Care Management (Signed)
ED CM met with patient to assist with [facility] for HIV nPEP. Assisted patient with contacting the patient assistance program. Patient received ID number [id number], Group number for medication assistance with Truvada., patient will call [employer] for medication assistance with Insentress patient will contact them in the am/ Patient was sent to [facility] on [address]. Patient verbalized understanding he was given ED CM contact information if he should have any further questions.

## 2022-12-03 NOTE — Discharge Instructions (Addendum)
As planned, keep your follow-up appointment for repeat lab work in 1 month.  And will also be a good idea for you to reestablish with primary care
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
## 2022-12-04 ENCOUNTER — Other Ambulatory Visit: Payer: Self-pay

## 2022-12-04 ENCOUNTER — Telehealth: Payer: Self-pay | Admitting: *Deleted

## 2022-12-04 ENCOUNTER — Telehealth (HOSPITAL_COMMUNITY): Payer: Self-pay | Admitting: Emergency Medicine

## 2022-12-04 ENCOUNTER — Other Ambulatory Visit (HOSPITAL_COMMUNITY): Payer: Self-pay

## 2022-12-04 MED ORDER — ELVITEG-COBIC-EMTRICIT-TENOFAF 150-150-200-10 MG PO TABS
1.0000 | ORAL_TABLET | Freq: Every day | ORAL | 0 refills | Status: AC
  Filled 2022-12-04: qty 30, 30d supply, fill #0
  Filled 2022-12-04: qty 23, 23d supply, fill #0

## 2022-12-04 NOTE — Telephone Encounter (Signed)
Pt called regarding NPeP Rx not being available at his pharmacy.  RNCM reviewed Rx and noticed pt was placed on regimen for pregnant pt.  RNCM consulted with EDP Kenton Kingfisher) to change to correct Rx.  RNCM contacted [employer] to ensure Rx is in stock prior to sending pt to pharmacy.    RNCM contacted [facility] to assist with enrolling pt in patient assistance programs to help pay for Rx as pt is uninsured.  Ilai Hiller J. Clydene Laming, RN, BSN, NCM  Transitions of Care  Nurse Case Manager  [facility]  Operative Services  [phone]

## 2022-12-04 NOTE — Telephone Encounter (Signed)
Med change

## 2023-05-20 IMAGING — US US SCROTUM W/ DOPPLER COMPLETE
1 series · 14 of 25 positions shown · non-contrast
Comparison: None.

CLINICAL DATA: Chronic right epididymal cyst.

EXAM:
SCROTAL ULTRASOUND
DOPPLER ULTRASOUND OF THE TESTICLES
TECHNIQUE: Complete ultrasound examination of the testicles, epididymis, and
other scrotal structures was performed. Color and spectral Doppler
ultrasound were also utilized to evaluate blood flow to the
testicles.

[Series 1: us scrotum w/ doppler complete · 0.05mm/px · 14 of 48 slices shown]
[im 1/48]
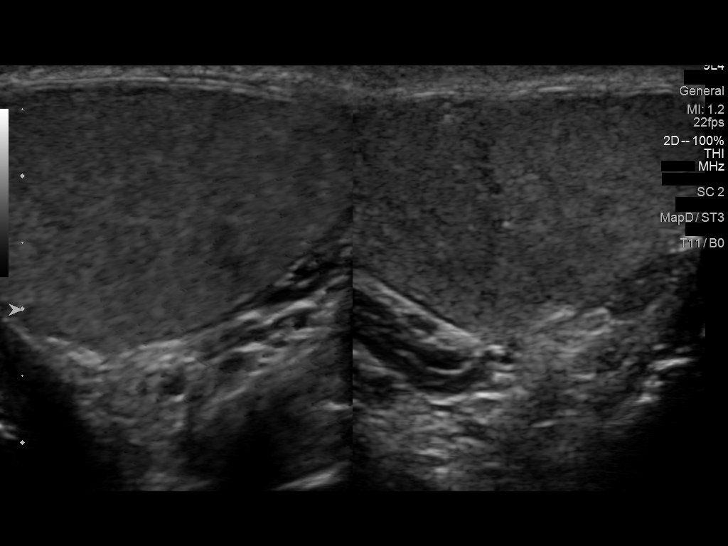
[im 4/48]
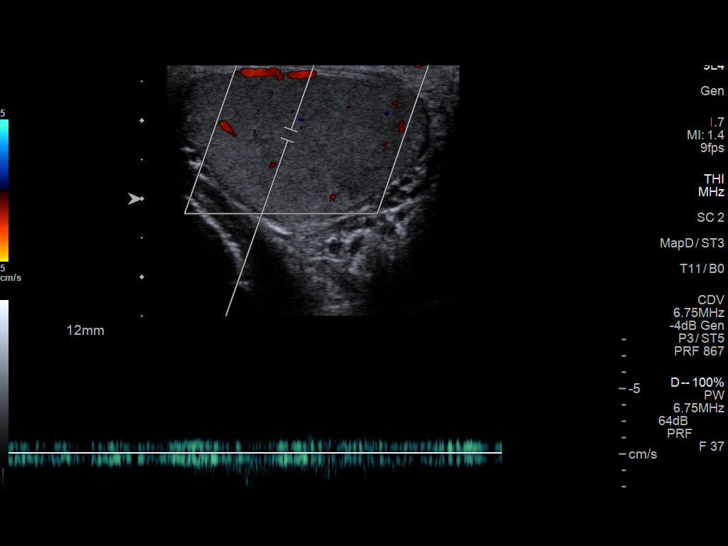
[im 8/48]
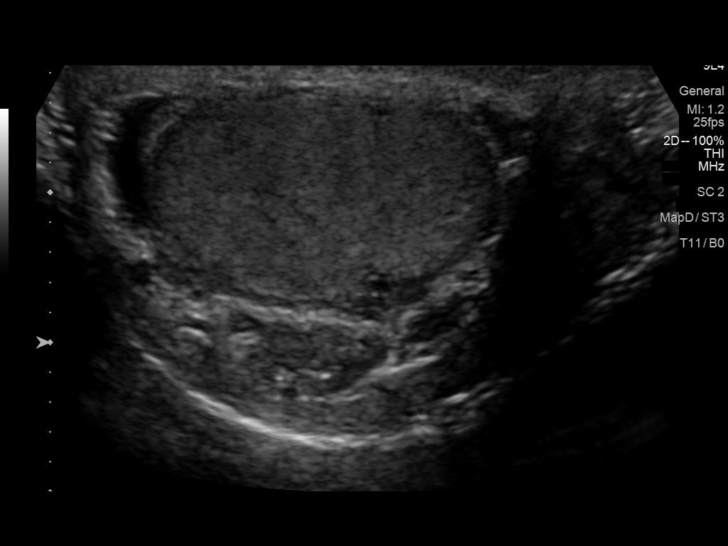
[im 12/48]
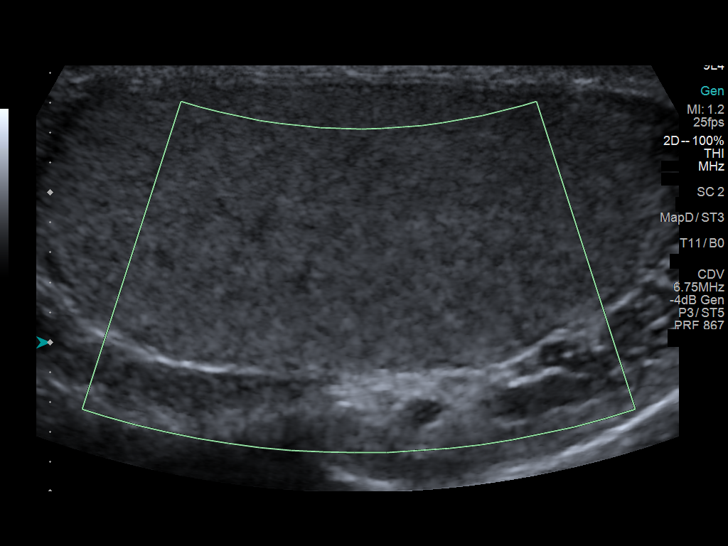
[im 16/48]
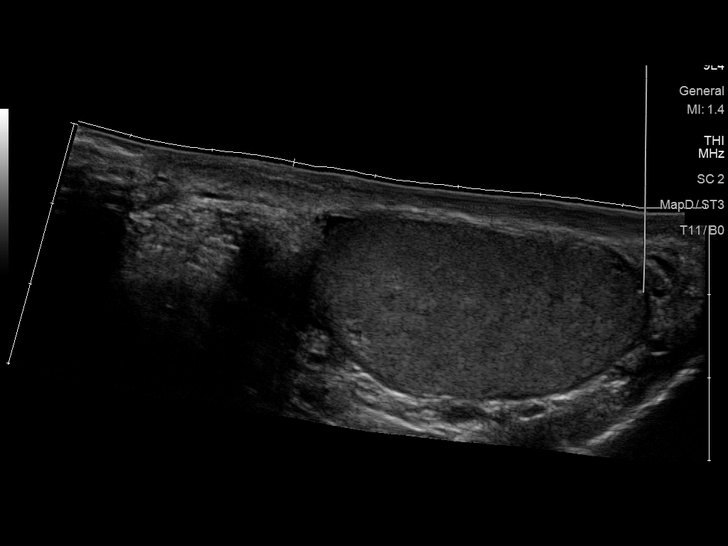
[im 18/48]
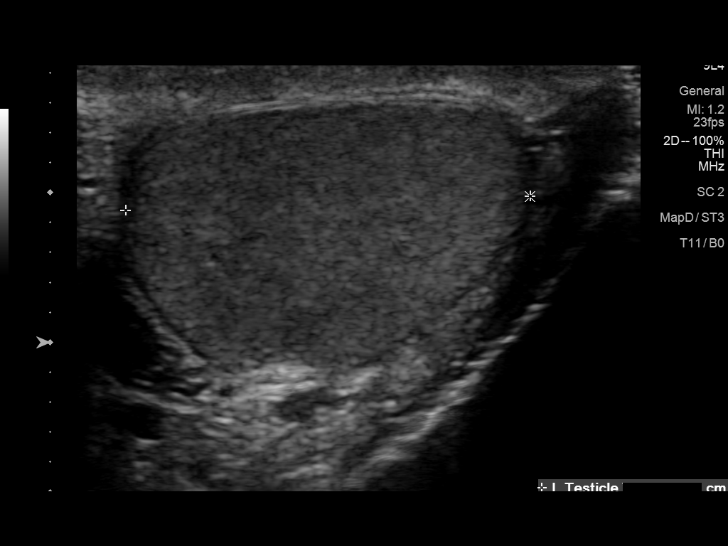
[im 22/48]
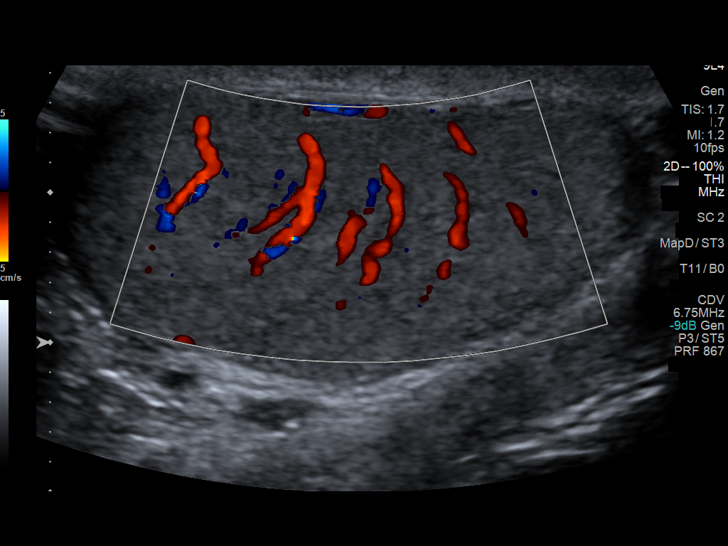
[im 26/48]
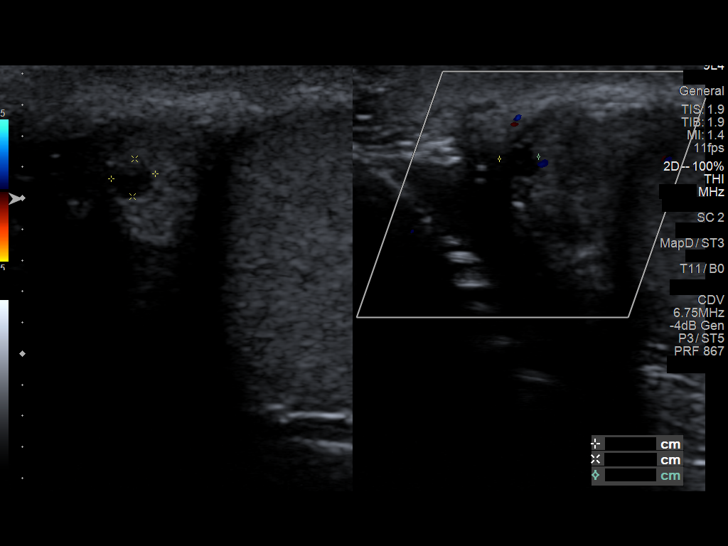
[im 30/48]
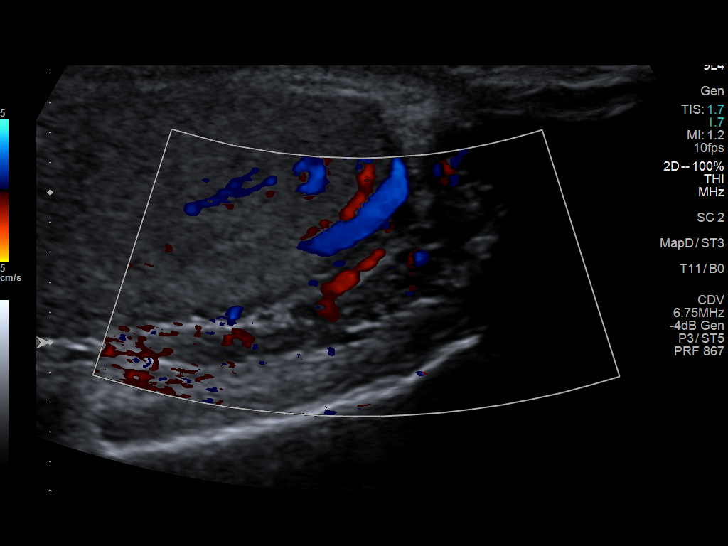
[im 32/48]
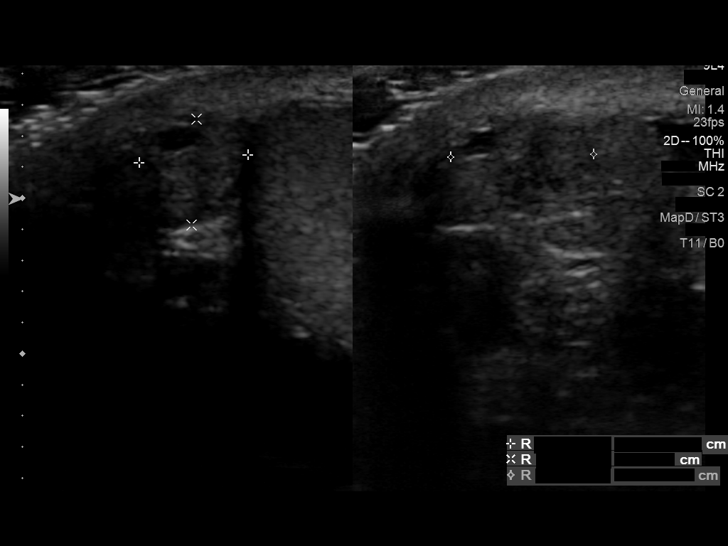
[im 36/48]
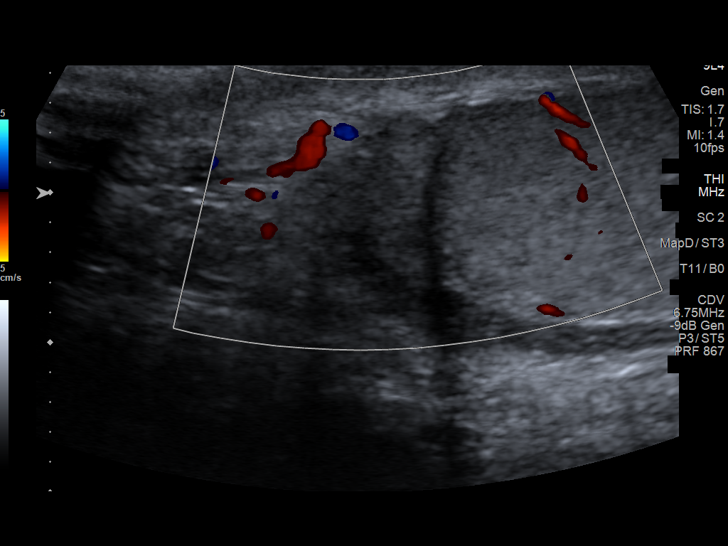
[im 40/48]
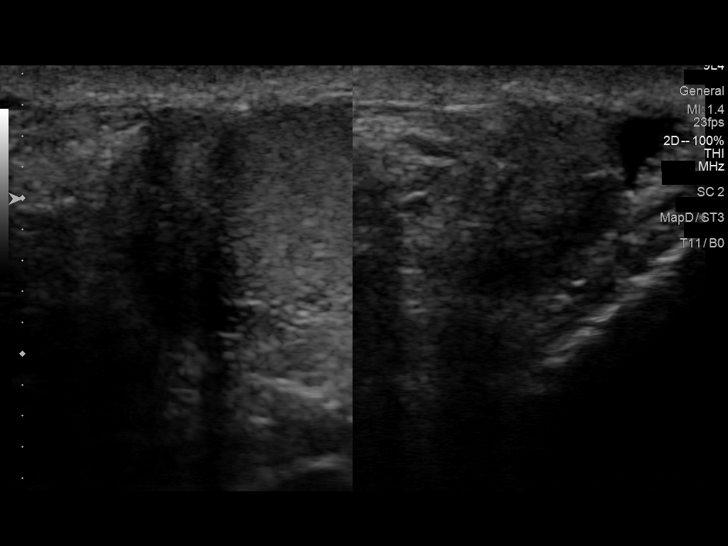
[im 44/48]
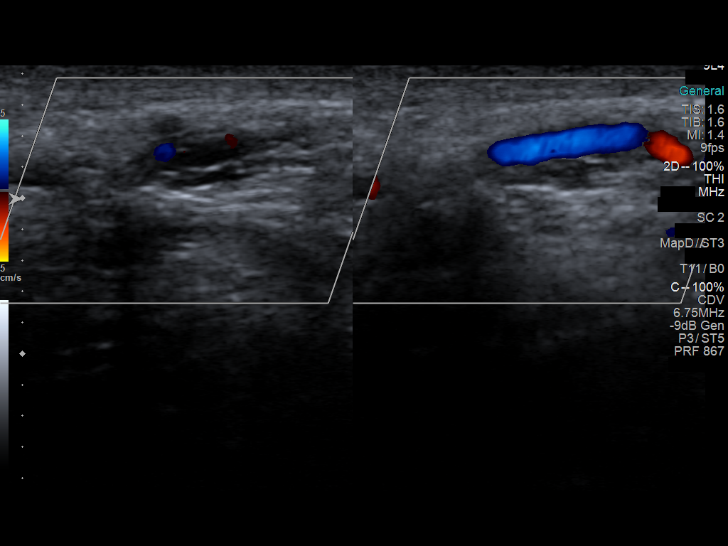
[im 48/48]
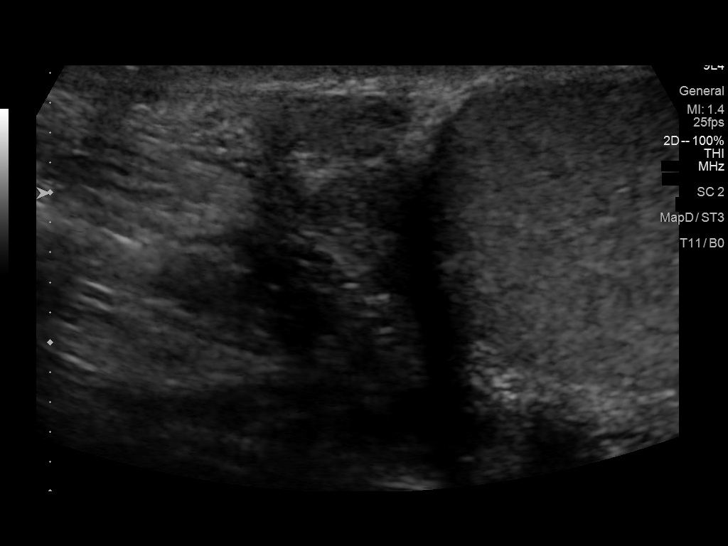

[14 of 25 positions shown; findings below may reference images not displayed]

FINDINGS: Right testicle

Measurements: 4.3 x 2.7 x 1.9 cm. No mass or microlithiasis
visualized.

Left testicle

Measurements: 4.0 x 2.7 x 1.7 cm. No mass or microlithiasis
visualized.

Right epididymis: 3 mm cyst in the head of the epididymis which
corresponds with patient's palpable area of concern.

Left epididymis:  Normal in size and appearance.

Hydrocele:  None visualized.

Varicocele:  None visualized.

Pulsed Doppler interrogation of both testes demonstrates normal low
resistance arterial and venous waveforms bilaterally.
IMPRESSION: 3 mm cyst in the head of the epididymis which corresponds with
patient's palpable area of concern.

No focal abnormality is seen in either testicle.

## 2023-09-02 DIAGNOSIS — R369 Urethral discharge, unspecified: Secondary | ICD-10-CM | POA: Diagnosis not present

## 2023-09-02 DIAGNOSIS — Z7251 High risk heterosexual behavior: Secondary | ICD-10-CM | POA: Diagnosis not present

## 2023-09-26 ENCOUNTER — Ambulatory Visit
Admission: EM | Admit: 2023-09-26 | Discharge: 2023-09-26 | Disposition: A | Discharge status: Home / Self Care | Payer: BC Managed Care – PPO | Attending: Family Medicine | Admitting: Family Medicine

## 2023-09-26 DIAGNOSIS — Z113 Encounter for screening for infections with a predominantly sexual mode of transmission: Secondary | ICD-10-CM | POA: Diagnosis not present

## 2023-09-26 DIAGNOSIS — Z0189 Encounter for other specified special examinations: Secondary | ICD-10-CM

## 2023-09-26 DIAGNOSIS — J020 Streptococcal pharyngitis: Secondary | ICD-10-CM | POA: Diagnosis not present

## 2023-09-26 LAB — POCT RAPID STREP A (OFFICE): Rapid Strep A Screen: POSITIVE — AB

## 2023-09-26 MED ORDER — AMOXICILLIN 875 MG PO TABS: 875.0000 mg | ORAL_TABLET | Freq: Two times a day (BID) | ORAL | 0 refills | Status: AC

## 2023-09-26 NOTE — ED Provider Notes (Signed)
[facility]  Note:  This document was prepared using Conservation officer, historic buildings and may include unintentional dictation errors.  MRN: [id number] DOB: Oct 13, 1985  Subjective:   Jacob Chung is a 37 y.o. male presenting for 2 chief complaints.  Reports 1 week history of malaise, throat pain, fatigue, drainage. Has had 1 sick contact at work. Wants STI testing. Had unprotected oral and penile sex. Denies dysuria, hematuria, urinary frequency, penile discharge, penile swelling, testicular pain, testicular swelling, anal pain, groin pain. Would like a complete STI check.   No current facility-administered medications for this encounter.  Current Outpatient Medications:    benzonatate (TESSALON) 100 MG capsule, Take 1 capsule (100 mg total) by mouth 3 (three) times daily as needed for cough., Disp: 30 capsule, Rfl: 0   cetirizine (ZYRTEC ALLERGY) 10 MG tablet, Take 1 tablet (10 mg total) by mouth daily., Disp: 30 tablet, Rfl: 0   elvitegravir-cobicistat-emtricitabine-tenofovir (GENVOYA) 150-150-200-10 MG TABS tablet, Take 1 tablet by mouth daily with breakfast., Disp: 30 tablet, Rfl: 0   emtricitabine-tenofovir (TRUVADA) 200-300 MG tablet, Take 1 tablet by mouth daily., Disp: 30 tablet, Rfl: 0   fluticasone (FLONASE ALLERGY RELIEF) 50 MCG/ACT nasal spray, 1 spray in each nostril, Disp: , Rfl:    loratadine (CLARITIN) 10 MG tablet, 1 tablet, Disp: , Rfl:    ondansetron (ZOFRAN) 4 MG tablet, Take 1 tablet (4 mg total) by mouth every 6 (six) hours., Disp: 28 tablet, Rfl: 0   promethazine-dextromethorphan (PROMETHAZINE-DM) 6.25-15 MG/5ML syrup, Take 5 mLs by mouth 3 (three) times daily as needed for cough., Disp: 200 mL, Rfl: 0   pseudoephedrine (SUDAFED) 30 MG tablet, Take 1 tablet (30 mg total) by mouth every 8 (eight) hours as needed for congestion., Disp: 30 tablet, Rfl: 0   No Known Allergies  Past Medical History:  Diagnosis Date   Seasonal allergies     Thrombocytopenia (HCC)      Past Surgical History:  Procedure Laterality Date   APPENDECTOMY      Family History  Problem Relation Age of Onset   Healthy Mother    Hypertension Mother    Healthy Father    Healthy Sister    Healthy Sister    Healthy Brother    COPD Maternal Grandmother        Smoker   Hypertension Maternal Grandmother    Pancreatic cancer Paternal Grandmother    Hypertension Other    Coronary artery disease Neg Hx    Heart failure Neg Hx     Social History   Tobacco Use   Smoking status: Never   Smokeless tobacco: Never  Vaping Use   Vaping status: Never Used  Substance Use Topics   Alcohol use: Yes    Comment: 3-4 drinks a week   Drug use: No    ROS   Objective:   Vitals: BP (!) 165/95 (BP Location: Right Arm)   Pulse 77   Temp 99.2 F (37.3 C) (Oral)   Resp 16   SpO2 98%   Physical Exam Constitutional:      General: He is not in acute distress.    Appearance: Normal appearance. He is well-developed and normal weight. He is not ill-appearing, toxic-appearing or diaphoretic.  HENT:     Head: Normocephalic and atraumatic.     Right Ear: External ear normal.     Left Ear: External ear normal.     Nose: Nose normal.     Mouth/Throat:  Mouth: Mucous membranes are moist.     Pharynx: Posterior oropharyngeal erythema (left sided) present. No pharyngeal swelling, oropharyngeal exudate or uvula swelling.     Tonsils: No tonsillar exudate or tonsillar abscesses. 0 on the right. 0 on the left.  Eyes:     General: No scleral icterus.       Right eye: No discharge.        Left eye: No discharge.     Extraocular Movements: Extraocular movements intact.  Cardiovascular:     Rate and Rhythm: Normal rate and regular rhythm.     Heart sounds: Normal heart sounds. No murmur heard.    No friction rub. No gallop.  Pulmonary:     Effort: Pulmonary effort is normal. No respiratory distress.     Breath sounds: Normal breath sounds. No stridor. No  wheezing, rhonchi or rales.  Musculoskeletal:     Cervical back: Normal range of motion.  Neurological:     Mental Status: He is alert and oriented to person, place, and time.  Psychiatric:        Mood and Affect: Mood normal.        Behavior: Behavior normal.        Thought Content: Thought content normal.        Judgment: Judgment normal.     Results for orders placed or performed during the hospital encounter of 09/26/23 (from the past 24 hours)  POCT rapid strep A     Status: Abnormal   Collection Time: 09/26/23 10:48 AM  Result Value Ref Range   Rapid Strep A Screen Positive (A)      Assessment and Plan :   PDMP not reviewed this encounter.  1. Strep pharyngitis   2. Patient requested diagnostic testing   3. Screen for STD (sexually transmitted disease)     STI check pending. Will treat for strep pharyngitis.  Patient is to start amoxicillin, use supportive care otherwise. Counseled patient on potential for adverse effects with medications prescribed/recommended today, ER and return-to-clinic precautions discussed, patient verbalized understanding.    Wallis Bamberg, [place] 09/26/23 1115

## 2023-09-26 NOTE — ED Triage Notes (Signed)
Pt requested STD's test. Reports condom broke.   Pt requested COVID test, after he is feeling sick x 1 week and coworkers are sick.

## 2023-09-27 LAB — HIV ANTIBODY (ROUTINE TESTING W REFLEX): HIV Screen 4th Generation wRfx: NONREACTIVE

## 2023-09-27 LAB — CYTOLOGY, (ORAL, ANAL, URETHRAL) ANCILLARY ONLY
Chlamydia: NEGATIVE
Chlamydia: NEGATIVE
Comment: NEGATIVE
Comment: NORMAL
Comment: NEGATIVE
Comment: NEGATIVE
Comment: NORMAL
Neisseria Gonorrhea: NEGATIVE
Neisseria Gonorrhea: NEGATIVE
Trichomonas: NEGATIVE

## 2023-09-27 LAB — SARS CORONAVIRUS 2 (TAT 6-24 HRS): SARS Coronavirus 2: NEGATIVE

## 2023-09-27 LAB — RPR: RPR Ser Ql: NONREACTIVE

## 2023-11-06 ENCOUNTER — Ambulatory Visit
Admission: EM | Admit: 2023-11-06 | Discharge: 2023-11-06 | Disposition: A | Discharge status: Home / Self Care | Payer: Self-pay | Attending: Family Medicine | Admitting: Family Medicine

## 2023-11-06 DIAGNOSIS — J101 Influenza due to other identified influenza virus with other respiratory manifestations: Secondary | ICD-10-CM | POA: Insufficient documentation

## 2023-11-06 DIAGNOSIS — Z113 Encounter for screening for infections with a predominantly sexual mode of transmission: Secondary | ICD-10-CM | POA: Insufficient documentation

## 2023-11-06 LAB — POC COVID19/FLU A&B COMBO
Covid Antigen, POC: NEGATIVE
Influenza A Antigen, POC: POSITIVE — AB
Influenza B Antigen, POC: NEGATIVE

## 2023-11-06 LAB — POCT RAPID STREP A (OFFICE): Rapid Strep A Screen: NEGATIVE

## 2023-11-06 MED ORDER — OSELTAMIVIR PHOSPHATE 75 MG PO CAPS: 75.0000 mg | ORAL_CAPSULE | Freq: Two times a day (BID) | ORAL | 0 refills | Status: AC

## 2023-11-06 NOTE — ED Provider Notes (Signed)
 [facility]  Note:  This document was prepared using Conservation officer, historic buildings and may include unintentional dictation errors.  MRN: [id number] DOB: 09-05-1986  Subjective:   Jeromey Kruer is a 38 y.o. male presenting for 3-day history of acute onset persistent sinus congestion, fatigue, malaise, nausea, vomiting.  Patient just got back from traveling.  Had strep at the end of December, wants a repeat test.  Would like STI testing.  Patient takes PrEP.  No history of asthma.  No current facility-administered medications for this encounter.  Current Outpatient Medications:    amoxicillin  (AMOXIL ) 875 MG tablet, Take 1 tablet (875 mg total) by mouth 2 (two) times daily., Disp: 20 tablet, Rfl: 0   benzonatate  (TESSALON ) 100 MG capsule, Take 1 capsule (100 mg total) by mouth 3 (three) times daily as needed for cough., Disp: 30 capsule, Rfl: 0   cetirizine  (ZYRTEC  ALLERGY) 10 MG tablet, Take 1 tablet (10 mg total) by mouth daily., Disp: 30 tablet, Rfl: 0   elvitegravir-cobicistat-emtricitabine -tenofovir  (GENVOYA ) 150-150-200-10 MG TABS tablet, Take 1 tablet by mouth daily with breakfast., Disp: 30 tablet, Rfl: 0   emtricitabine -tenofovir  (TRUVADA ) 200-300 MG tablet, Take 1 tablet by mouth daily., Disp: 30 tablet, Rfl: 0   fluticasone (FLONASE ALLERGY RELIEF) 50 MCG/ACT nasal spray, 1 spray in each nostril, Disp: , Rfl:    loratadine (CLARITIN) 10 MG tablet, 1 tablet, Disp: , Rfl:    ondansetron  (ZOFRAN ) 4 MG tablet, Take 1 tablet (4 mg total) by mouth every 6 (six) hours., Disp: 28 tablet, Rfl: 0   promethazine -dextromethorphan (PROMETHAZINE -DM) 6.25-15 MG/5ML syrup, Take 5 mLs by mouth 3 (three) times daily as needed for cough., Disp: 200 mL, Rfl: 0   pseudoephedrine  (SUDAFED) 30 MG tablet, Take 1 tablet (30 mg total) by mouth every 8 (eight) hours as needed for congestion., Disp: 30 tablet, Rfl: 0   No Known Allergies  Past Medical History:  Diagnosis Date    Seasonal allergies    Thrombocytopenia (HCC)      Past Surgical History:  Procedure Laterality Date   APPENDECTOMY      Family History  Problem Relation Age of Onset   Healthy Mother    Hypertension Mother    Healthy Father    Healthy Sister    Healthy Sister    Healthy Brother    COPD Maternal Grandmother        Smoker   Hypertension Maternal Grandmother    Pancreatic cancer Paternal Grandmother    Hypertension Other    Coronary artery disease Neg Hx    Heart failure Neg Hx     Social History   Tobacco Use   Smoking status: Never   Smokeless tobacco: Never  Vaping Use   Vaping status: Never Used  Substance Use Topics   Alcohol use: Yes    Comment: weekly   Drug use: No    ROS   Objective:   Vitals: BP 139/88 (BP Location: Left Arm)   Pulse 92   Temp (!) 101 F (38.3 C) (Oral)   Resp 20   SpO2 98%   Physical Exam Constitutional:      General: He is not in acute distress.    Appearance: Normal appearance. He is well-developed and normal weight. He is not ill-appearing, toxic-appearing or diaphoretic.  HENT:     Head: Normocephalic and atraumatic.     Right Ear: Tympanic membrane, ear canal and external ear normal. No drainage, swelling or tenderness. No middle ear  effusion. There is no impacted cerumen. Tympanic membrane is not erythematous or bulging.     Left Ear: Tympanic membrane, ear canal and external ear normal. No drainage, swelling or tenderness.  No middle ear effusion. There is no impacted cerumen. Tympanic membrane is not erythematous or bulging.     Nose: Nose normal. No congestion or rhinorrhea.     Mouth/Throat:     Mouth: Mucous membranes are moist.     Pharynx: No oropharyngeal exudate or posterior oropharyngeal erythema.  Eyes:     General: No scleral icterus.       Right eye: No discharge.        Left eye: No discharge.     Extraocular Movements: Extraocular movements intact.     Conjunctiva/sclera: Conjunctivae normal.   Cardiovascular:     Rate and Rhythm: Normal rate and regular rhythm.     Heart sounds: Normal heart sounds. No murmur heard.    No friction rub. No gallop.  Pulmonary:     Effort: Pulmonary effort is normal. No respiratory distress.     Breath sounds: Normal breath sounds. No stridor. No wheezing, rhonchi or rales.  Musculoskeletal:     Cervical back: Normal range of motion and neck supple. No rigidity. No muscular tenderness.  Skin:    General: Skin is warm and dry.  Neurological:     General: No focal deficit present.     Mental Status: He is alert and oriented to person, place, and time.  Psychiatric:        Mood and Affect: Mood normal.        Behavior: Behavior normal.        Thought Content: Thought content normal.        Judgment: Judgment normal.     Results for orders placed or performed during the hospital encounter of 11/06/23 (from the past 24 hours)  POC Covid19/Flu A&B Antigen     Status: Abnormal   Collection Time: 11/06/23  7:18 PM  Result Value Ref Range   Influenza A Antigen, POC Positive (A) Negative   Influenza B Antigen, POC Negative Negative   Covid Antigen, POC Negative Negative  POCT rapid strep A     Status: None   Collection Time: 11/06/23  7:37 PM  Result Value Ref Range   Rapid Strep A Screen Negative Negative    Assessment and Plan :   PDMP not reviewed this encounter.  1. Influenza A   2. Screen for STD (sexually transmitted disease)    Deferred imaging given clear cardiopulmonary exam, hemodynamically stable vital signs. Will cover for influenza with Tamiflu  given + results.  Use supportive care, rest, fluids, hydration, light meals, schedule Tylenol and ibuprofen. Counseled patient on potential for adverse effects with medications prescribed today, patient verbalized understanding. ER and return-to-clinic precautions discussed, patient verbalized understanding.    Christopher Savannah, [place] 11/06/23 8060

## 2023-11-06 NOTE — Discharge Instructions (Addendum)
 Start Tamiflu  for your influenza infection. For sore throat or cough try using a honey-based tea. Use 3 teaspoons of honey with juice squeezed from half lemon. Place shaved pieces of ginger into 1/2-1 cup of water and warm over stove top. Then mix the ingredients and repeat every 4 hours as needed. Please take Tylenol 500mg -650mg  once every 6 hours for fevers, aches and pains. Hydrate very well with at least 2 liters (64 ounces) of water. Eat light meals such as soups (chicken and noodles, chicken wild rice, vegetable).  Do not eat any foods that you are allergic to.  Start an antihistamine like Zyrtec  (10mg  daily) for postnasal drainage, sinus congestion.  You can take this together with pseudoephedrine  (Sudafed) at a dose of 30 mg 3 times a day or twice daily as needed for the same kind of congestion.

## 2023-11-06 NOTE — ED Triage Notes (Signed)
 Pt c/o n/v started 2/2-also c/o head congestion/fatigue-taking mucinex-NAD-steady gait

## 2023-11-07 ENCOUNTER — Telehealth: Payer: Self-pay

## 2023-11-07 LAB — CYTOLOGY, (ORAL, ANAL, URETHRAL) ANCILLARY ONLY
Chlamydia: NEGATIVE
Comment: NEGATIVE
Comment: NEGATIVE
Comment: NORMAL
Neisseria Gonorrhea: NEGATIVE
Trichomonas: NEGATIVE
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
## 2024-11-10 ENCOUNTER — Ambulatory Visit (HOSPITAL_BASED_OUTPATIENT_CLINIC_OR_DEPARTMENT_OTHER): Payer: Self-pay
